# Patient Record
Sex: Male | Born: 1960 | Race: White | Hispanic: No | Marital: Single | Smoking: Current every day smoker
Health system: Southern US, Community
[De-identification: ages and names within clinical notes are randomized; demographics above are authoritative.]

## PROBLEM LIST (undated history)

## (undated) DIAGNOSIS — K635 Polyp of colon: Secondary | ICD-10-CM

## (undated) DIAGNOSIS — F329 Major depressive disorder, single episode, unspecified: Secondary | ICD-10-CM

## (undated) DIAGNOSIS — R7989 Other specified abnormal findings of blood chemistry: Secondary | ICD-10-CM

## (undated) DIAGNOSIS — T7840XA Allergy, unspecified, initial encounter: Secondary | ICD-10-CM

## (undated) DIAGNOSIS — K6289 Other specified diseases of anus and rectum: Secondary | ICD-10-CM

## (undated) DIAGNOSIS — M1990 Unspecified osteoarthritis, unspecified site: Secondary | ICD-10-CM

## (undated) DIAGNOSIS — F32A Depression, unspecified: Secondary | ICD-10-CM

## (undated) HISTORY — DX: Polyp of colon: K63.5

## (undated) HISTORY — DX: Allergy, unspecified, initial encounter: T78.40XA

## (undated) HISTORY — PX: VASECTOMY: SHX75

## (undated) HISTORY — PX: ELBOW SURGERY: SHX618

## (undated) HISTORY — DX: Depression, unspecified: F32.A

## (undated) HISTORY — DX: Other specified abnormal findings of blood chemistry: R79.89

## (undated) HISTORY — DX: Major depressive disorder, single episode, unspecified: F32.9

## (undated) HISTORY — DX: Unspecified osteoarthritis, unspecified site: M19.90

## (undated) HISTORY — DX: Other specified diseases of anus and rectum: K62.89

## (undated) HISTORY — PX: TONSILLECTOMY: SUR1361

## (undated) HISTORY — PX: WISDOM TOOTH EXTRACTION: SHX21

---

## 2004-07-25 ENCOUNTER — Ambulatory Visit: Payer: Self-pay | Admitting: Family Medicine

## 2004-09-30 ENCOUNTER — Ambulatory Visit: Payer: Self-pay | Admitting: Family Medicine

## 2005-06-20 ENCOUNTER — Ambulatory Visit: Payer: Self-pay | Admitting: Family Medicine

## 2005-07-17 ENCOUNTER — Ambulatory Visit: Payer: Self-pay | Admitting: Family Medicine

## 2005-07-24 ENCOUNTER — Ambulatory Visit: Payer: Self-pay | Admitting: Family Medicine

## 2007-05-01 ENCOUNTER — Emergency Department (HOSPITAL_COMMUNITY): Admission: EM | Admit: 2007-05-01 | Discharge: 2007-05-01 | Payer: Self-pay | Admitting: Emergency Medicine

## 2008-08-22 ENCOUNTER — Emergency Department (HOSPITAL_COMMUNITY): Admission: EM | Admit: 2008-08-22 | Discharge: 2008-08-22 | Payer: Self-pay | Admitting: Emergency Medicine

## 2008-08-30 ENCOUNTER — Encounter: Admission: RE | Admit: 2008-08-30 | Discharge: 2008-08-30 | Payer: Self-pay | Admitting: Chiropractic Medicine

## 2010-05-06 ENCOUNTER — Telehealth: Payer: Self-pay | Admitting: Family Medicine

## 2010-05-07 ENCOUNTER — Ambulatory Visit: Payer: Self-pay | Admitting: Family Medicine

## 2010-05-07 DIAGNOSIS — F3289 Other specified depressive episodes: Secondary | ICD-10-CM | POA: Insufficient documentation

## 2010-05-07 DIAGNOSIS — M1990 Unspecified osteoarthritis, unspecified site: Secondary | ICD-10-CM | POA: Insufficient documentation

## 2010-05-07 DIAGNOSIS — F329 Major depressive disorder, single episode, unspecified: Secondary | ICD-10-CM | POA: Insufficient documentation

## 2010-05-07 DIAGNOSIS — J309 Allergic rhinitis, unspecified: Secondary | ICD-10-CM | POA: Insufficient documentation

## 2010-05-16 ENCOUNTER — Encounter: Payer: Self-pay | Admitting: Family Medicine

## 2010-07-09 NOTE — Progress Notes (Signed)
Summary: REQUEST FOR APPT (RE-EST PT)  Phone Note Call from Patient   Caller: Patient Summary of Call: This pt called in to request an appt for acute problem of cough,  congestion.... Pt was last seen by Dr Clent Ridges in 2007 and will need to re-est.... Okay to work pt into schedule today or tomorrow?  Please advise..... Thanks.  Pt can be reached at [phone]  or   [phone].  Initial call taken by: Debbra Riding,  May 06, 2010 12:06 PM  Follow-up for Phone Call        set up for tomorrow please  Follow-up by: Nelwyn Salisbury MD,  May 06, 2010 1:46 PM  Additional Follow-up for Phone Call Additional follow up Details #1::        LMTCB  so appt can be scheduled.  Additional Follow-up by: Debbra Riding,  May 06, 2010 3:09 PM

## 2010-07-09 NOTE — Letter (Signed)
Summary: PATIENT HX FORM  PATIENT HX FORM   Imported By: Georgian Co 05/16/2010 09:41:41  _____________________________________________________________________  External Attachment:    Type:   Image     Comment:   External Document

## 2010-07-09 NOTE — Assessment & Plan Note (Signed)
Summary: COUGH, CONGESTION, H/A (PT RE-EST) OK PER DR FRY // RS   Vital Signs:  Patient profile:   50 year old male Weight:      190 pounds O2 Sat:      96 % Temp:     98.7 degrees F BP sitting:   122 / 86  (left arm) Cuff size:   regular  Vitals Entered By: Pura Spice, RN (May 07, 2010 11:15 AM) CC: to restablish cough sinus drainage headache   History of Present Illness: 50 yr old male to re-establish with [place] and for some allergy problems. We have not seen him for 3-4 years. He says he has been healthy and did not see any doctors during that time. Now for several months he has had nasal congestion, PND, ST, and an occasional dry cough. No fever. Tried Claritin and Afrin sprays with no relief.   Preventive Screening-Counseling & Management  Alcohol-Tobacco     Smoking Status: current     Packs/Day: 1.5  Allergies (verified): 1)  ! Darvocet  Past History:  Past Medical History: Allergic rhinitis Depression Osteoarthritis  Past Surgical History: Vasectomy right elbow reconstructive surgery   Family History: Reviewed history and no changes required. Family History of Arthritis Family History of CAD Male 1st degree relative <50 Family History Depression Family History High cholesterol Family History Hypertension  Social History: Reviewed history and no changes required. Divorced Current Smoker Smoking Status:  current Packs/Day:  1.5  Review of Systems  The patient denies anorexia, fever, weight loss, weight gain, vision loss, decreased hearing, hoarseness, chest pain, syncope, dyspnea on exertion, peripheral edema, prolonged cough, headaches, hemoptysis, abdominal pain, melena, hematochezia, severe indigestion/heartburn, hematuria, incontinence, genital sores, muscle weakness, suspicious skin lesions, transient blindness, difficulty walking, depression, unusual weight change, abnormal bleeding, enlarged lymph nodes, angioedema, breast masses, and  testicular masses.    Physical Exam  General:  Well-developed,well-nourished,in no acute distress; alert,appropriate and cooperative throughout examination Head:  Normocephalic and atraumatic without obvious abnormalities. No apparent alopecia or balding. Eyes:  No corneal or conjunctival inflammation noted. EOMI. Perrla. Funduscopic exam benign, without hemorrhages, exudates or papilledema. Vision grossly normal. Ears:  External ear exam shows no significant lesions or deformities.  Otoscopic examination reveals clear canals, tympanic membranes are intact bilaterally without bulging, retraction, inflammation or discharge. Hearing is grossly normal bilaterally. Nose:  External nasal examination shows no deformity or inflammation. Nasal mucosa are pink and moist without lesions or exudates. Mouth:  Oral mucosa and oropharynx without lesions or exudates.  Teeth in good repair. Neck:  No deformities, masses, or tenderness noted. Lungs:  Normal respiratory effort, chest expands symmetrically. Lungs are clear to auscultation, no crackles or wheezes.   Impression & Recommendations:  Problem # 1:  ALLERGIC RHINITIS (ICD-477.9)  His updated medication list for this problem includes:    Flonase 50 Mcg/act Susp (Fluticasone propionate) .Marland Kitchen... 2 sprays each nostril once daily    Allegra Allergy 180 Mg Tabs (Fexofenadine hcl) ..... Once daily  Problem # 2:  OSTEOARTHRITIS (ICD-715.90)  Problem # 3:  DEPRESSION (ICD-311)  Complete Medication List: 1)  Flonase 50 Mcg/act Susp (Fluticasone propionate) .... 2 sprays each nostril once daily 2)  Allegra Allergy 180 Mg Tabs (Fexofenadine hcl) .... Once daily  Patient Instructions: 1)  Please schedule a follow-up appointment as needed .  Prescriptions: FLONASE 50 MCG/ACT SUSP (FLUTICASONE PROPIONATE) 2 sprays each nostril once daily  #30 x 11   Entered and Authorized by:   Tera Mater  Clent Ridges MD   Signed by:   Nelwyn Salisbury MD on 05/07/2010   Method used:    Electronically to        [id number] (retail)       [address] [place], [place]  [address]       Ph: [phone]       Fax: [phone]   RxID:   [id number]    Orders Added: 1)  New Patient Level III [id number]

## 2011-05-16 ENCOUNTER — Telehealth: Payer: Self-pay | Admitting: Family Medicine

## 2011-05-16 NOTE — Telephone Encounter (Signed)
Pt walked in and complained of having neck pain and trouble sleeping. Dr. Clent Ridges suggested taking Advil 800 mg take 1 po q6hrs and use moist heat. Advised pt to make a appointment to see Dr. Clent Ridges.

## 2011-05-19 ENCOUNTER — Ambulatory Visit (INDEPENDENT_AMBULATORY_CARE_PROVIDER_SITE_OTHER): Payer: BC Managed Care – PPO | Admitting: Family Medicine

## 2011-05-19 ENCOUNTER — Encounter: Payer: Self-pay | Admitting: Family Medicine

## 2011-05-19 DIAGNOSIS — J111 Influenza due to unidentified influenza virus with other respiratory manifestations: Secondary | ICD-10-CM

## 2011-05-19 MED ORDER — OSELTAMIVIR PHOSPHATE 75 MG PO CAPS: 75.0000 mg | ORAL_CAPSULE | Freq: Two times a day (BID) | ORAL | Status: AC

## 2011-05-19 NOTE — Progress Notes (Signed)
  Subjective:    Patient ID: Christopher Solis, male    DOB: 08-Jul-1960, 50 y.o.   MRN: [id number]  HPI Here for 2 days of body aches, fevers, and a HA. Some nausea without vomiting, no cough.   Review of Systems  Constitutional: Positive for fever.  HENT: Negative.   Eyes: Negative.   Respiratory: Negative.   Musculoskeletal: Positive for myalgias.       Objective:   Physical Exam  Constitutional: He appears well-developed and well-nourished.  HENT:  Left Ear: External ear normal.  Nose: Nose normal.  Mouth/Throat: Oropharynx is clear and moist. No oropharyngeal exudate.  Eyes: Conjunctivae are normal.  Neck: No thyromegaly present.  Pulmonary/Chest: Effort normal and breath sounds normal. No respiratory distress. He has no wheezes. He has no rales. He exhibits no tenderness.  Lymphadenopathy:    He has no cervical adenopathy.          Assessment & Plan:  Rest, fluids, Motrin prn. Take Tamiflu. Off work today and tomorrow

## 2011-06-24 ENCOUNTER — Ambulatory Visit (INDEPENDENT_AMBULATORY_CARE_PROVIDER_SITE_OTHER): Payer: BC Managed Care – PPO | Admitting: Family Medicine

## 2011-06-24 ENCOUNTER — Encounter: Payer: Self-pay | Admitting: Family Medicine

## 2011-06-24 VITALS — BP 110/70 | HR 102 | Temp 98.9°F | Wt 174.0 lb

## 2011-06-24 DIAGNOSIS — J329 Chronic sinusitis, unspecified: Secondary | ICD-10-CM

## 2011-06-24 MED ORDER — HYDROCODONE-HOMATROPINE 5-1.5 MG/5ML PO SYRP: 5.0000 mL | ORAL_SOLUTION | ORAL | Status: AC | PRN

## 2011-06-24 MED ORDER — METHYLPREDNISOLONE ACETATE 80 MG/ML IJ SUSP
120.0000 mg | Freq: Once | INTRAMUSCULAR | Status: AC
  Administered 2011-06-24: 120 mg via INTRAMUSCULAR

## 2011-06-24 MED ORDER — AMOXICILLIN-POT CLAVULANATE 875-125 MG PO TABS: 1.0000 | ORAL_TABLET | Freq: Two times a day (BID) | ORAL | Status: AC

## 2011-06-24 NOTE — Progress Notes (Signed)
Addended by: Aniceto Boss A on: 06/24/2011 12:05 PM   Modules accepted: Orders

## 2011-06-24 NOTE — Progress Notes (Signed)
  Subjective:    Patient ID: Christopher Solis, male    DOB: [birth year], 51 y.o.   MRN: [id number]  HPI Here for 3 days of HA, sinus pressure, PND, ST, and a dry cough. No fever.    Review of Systems  Constitutional: Negative.   HENT: Positive for congestion, postnasal drip and sinus pressure.   Eyes: Negative.   Respiratory: Positive for cough.        Objective:   Physical Exam  Pulmonary/Chest: Effort normal and breath sounds normal. No respiratory distress. He has no wheezes. He has no rales. He exhibits no tenderness.          Assessment & Plan:  Drink fluids. Off work yesterday and today

## 2011-06-25 ENCOUNTER — Telehealth: Payer: Self-pay | Admitting: Family Medicine

## 2011-06-25 NOTE — Telephone Encounter (Signed)
Pt need work note from 06-25-2011 and return to work on 06-26-2011

## 2011-06-26 NOTE — Telephone Encounter (Signed)
Please get him a work note  

## 2011-06-26 NOTE — Telephone Encounter (Signed)
Spoke with pt and he does not need the note now.

## 2011-12-19 ENCOUNTER — Ambulatory Visit (INDEPENDENT_AMBULATORY_CARE_PROVIDER_SITE_OTHER): Payer: BC Managed Care – PPO | Admitting: Family Medicine

## 2011-12-19 ENCOUNTER — Encounter: Payer: Self-pay | Admitting: Family Medicine

## 2011-12-19 VITALS — BP 118/80 | HR 85 | Temp 98.9°F | Wt 176.0 lb

## 2011-12-19 DIAGNOSIS — M542 Cervicalgia: Secondary | ICD-10-CM

## 2011-12-19 MED ORDER — HYDROCODONE-ACETAMINOPHEN 10-325 MG PO TABS: 1.0000 | ORAL_TABLET | Freq: Four times a day (QID) | ORAL | Status: DC | PRN

## 2011-12-19 NOTE — Progress Notes (Signed)
  Subjective:    Patient ID: Christopher Solis, male    DOB: 12-09-1960, 51 y.o.   MRN: [id number]  HPI Here for chronic pain in the neck which started about 5 yrs ago. The pain is steadily getting worse. His eck is stiff and he gets sharp, sometimes intense, pains in the neck which shoot down both upper arms. No weakness or numbness of the arms. He takes Ibuprofen daily for this. No trauma history. His job is very demanding physically.  Review of Systems  Constitutional: Negative.   HENT: Positive for neck pain and neck stiffness.   Respiratory: Negative.   Cardiovascular: Negative.        Objective:   Physical Exam  Constitutional: He is oriented to person, place, and time. He appears well-developed and well-nourished.  Musculoskeletal:       He has very limited ROM of the neck in all directions. There is a lot of spasm in the muscles of the neck, and he is very tender over the C4 to C7 area.   Neurological: He is alert and oriented to person, place, and time. No cranial nerve deficit.          Assessment & Plan:  Neck pain. Set up a cervical spine MRI soon.

## 2011-12-21 ENCOUNTER — Encounter: Payer: Self-pay | Admitting: Family Medicine

## 2011-12-22 ENCOUNTER — Telehealth: Payer: Self-pay | Admitting: Family Medicine

## 2011-12-22 NOTE — Telephone Encounter (Signed)
I did give pt work note.

## 2011-12-24 ENCOUNTER — Other Ambulatory Visit: Payer: BC Managed Care – PPO

## 2012-03-11 ENCOUNTER — Ambulatory Visit (INDEPENDENT_AMBULATORY_CARE_PROVIDER_SITE_OTHER): Payer: BC Managed Care – PPO | Admitting: Family Medicine

## 2012-03-11 ENCOUNTER — Encounter: Payer: Self-pay | Admitting: Family Medicine

## 2012-03-11 VITALS — BP 116/80 | HR 88 | Temp 98.6°F | Wt 181.0 lb

## 2012-03-11 DIAGNOSIS — IMO0002 Reserved for concepts with insufficient information to code with codable children: Secondary | ICD-10-CM

## 2012-03-11 DIAGNOSIS — S76019A Strain of muscle, fascia and tendon of unspecified hip, initial encounter: Secondary | ICD-10-CM

## 2012-03-11 MED ORDER — HYDROCODONE-ACETAMINOPHEN 10-325 MG PO TABS: 1.0000 | ORAL_TABLET | Freq: Four times a day (QID) | ORAL | Status: AC | PRN

## 2012-03-11 NOTE — Progress Notes (Signed)
  Subjective:    Patient ID: Christopher Solis, male    DOB: 02-04-1961, 51 y.o.   MRN: [id number]  HPI Here for 2 days of sharp pains in the anterior right hip. Yesterday morning as he first got out of bed he felt a sudden pulling in this area with pain, and it has hurt ever since. Using heat and Vicodin.    Review of Systems  Constitutional: Negative.   Musculoskeletal: Positive for arthralgias.       Objective:   Physical Exam  Constitutional:       In pain, has a slight limp  Musculoskeletal:       Tender over the right anterior superior iliac spine and the proximal thigh, full ROM          Assessment & Plan:  This is a hip flexor strain. Written out of work yesterday and today. Rest, heat, Vicodin. This should resolve over the next 4-6 weeks

## 2012-03-12 ENCOUNTER — Ambulatory Visit: Payer: BC Managed Care – PPO | Admitting: Family Medicine

## 2012-03-15 ENCOUNTER — Telehealth: Payer: Self-pay | Admitting: Family Medicine

## 2012-03-15 NOTE — Telephone Encounter (Signed)
Note was approved and I gave to pt.

## 2012-03-15 NOTE — Telephone Encounter (Signed)
Patient needs a note for being out of work from last Wednesday through today, being released tomorrow to work.  Thanks

## 2012-05-11 ENCOUNTER — Ambulatory Visit (INDEPENDENT_AMBULATORY_CARE_PROVIDER_SITE_OTHER): Payer: BC Managed Care – PPO | Admitting: Family Medicine

## 2012-05-11 ENCOUNTER — Encounter: Payer: Self-pay | Admitting: Family Medicine

## 2012-05-11 VITALS — BP 130/84 | HR 104 | Temp 98.9°F | Wt 178.0 lb

## 2012-05-11 DIAGNOSIS — J329 Chronic sinusitis, unspecified: Secondary | ICD-10-CM

## 2012-05-11 MED ORDER — AZITHROMYCIN 250 MG PO TABS: ORAL_TABLET | ORAL | Status: DC

## 2012-05-11 NOTE — Progress Notes (Signed)
  Subjective:    Patient ID: Christopher Solis, male    DOB: 1961-05-25, 51 y.o.   MRN: [id number]  HPI Here for one week of sinus pressure, PND, HA, ST, and a dry cough, no fever.    Review of Systems  Constitutional: Negative.   HENT: Positive for congestion, postnasal drip and sinus pressure.   Eyes: Negative.   Respiratory: Positive for cough.        Objective:   Physical Exam  Constitutional: He appears well-developed and well-nourished.  HENT:  Right Ear: External ear normal.  Left Ear: External ear normal.  Nose: Nose normal.  Mouth/Throat: Oropharynx is clear and moist.  Eyes: Conjunctivae normal are normal.  Neck: No thyromegaly present.  Pulmonary/Chest: Effort normal and breath sounds normal.  Lymphadenopathy:    He has no cervical adenopathy.          Assessment & Plan:  Add Mucinex D bid

## 2012-05-17 ENCOUNTER — Telehealth: Payer: Self-pay | Admitting: Family Medicine

## 2012-05-17 MED ORDER — AMOXICILLIN-POT CLAVULANATE 875-125 MG PO TABS: 1.0000 | ORAL_TABLET | Freq: Two times a day (BID) | ORAL | Status: DC

## 2012-05-17 NOTE — Telephone Encounter (Signed)
Call in in Augmentin 875 bid for 10 days

## 2012-05-17 NOTE — Telephone Encounter (Signed)
Patient Information:  Caller Name: Jorja Loa  Phone: [phone]  Patient: Christopher Solis, Christopher Solis  Gender: Male  DOB: 1961/02/27  Age: 51 Years  PCP: [facility])   Symptoms  Reason For Call & Symptoms: Patient states he was in the office on 05/11/12 diagnosed with sinusitis. Given Zpack medicaiton  and taking Mucinex D. Patient states he is not better.  Sinus pressure, throat irritation, +coughing productive yellow.  Reviewed Health History In EMR: Yes  Reviewed Medications In EMR: Yes  Reviewed Allergies In EMR: Yes  Reviewed Surgeries / Procedures: No  Date of Onset of Symptoms: 05/03/2012  Treatments Tried: Mucinex D, Zpack  Treatments Tried Worked: No  Guideline(s) Used:  Sinus Pain and Congestion  Disposition Per Guideline:   See Today or Tomorrow in Office  Reason For Disposition Reached:   Sinus congestion (pressure, fullness) present > 10 days  Advice Given:  Reassurance:   Sinus congestion is a normal part of a cold.  Usually home treatment with nasal washes can prevent an actual bacterial sinus infection.  For a Runny Nose With Profuse Discharge:  Nasal mucus and discharge helps to wash viruses and bacteria out of the nose and sinuses.  Blowing the nose is all that is needed.  For a Stuffy Nose - Use Nasal Washes:  Introduction: Saline (salt water) nasal irrigation (nasal wash) is an effective and simple home remedy for treating stuffy nose and sinus congestion. The nose can be irrigated by pouring, spraying, or squirting salt water into the nose and then letting it run back out.  How it Helps: The salt water rinses out excess mucus, washes out any irritants (dust, allergens) that might be present, and moistens the nasal cavity.  Methods: There are several ways to perform nasal irrigation. You can use a saline nasal spray bottle (available over-the-counter), a rubber ear syringe, a medical syringe without the needle, or a Neti Pot.  Pain and Fever Medicines:  Ibuprofen (e.g., Motrin, Advil):  Another choice is to take 600 mg (three 200 mg pills) by mouth every 8 hours.  Hydration:  Drink plenty of liquids (6-8 glasses of water daily). If the air in your home is dry, use a cool mist humidifier  Hydration:  Drink plenty of liquids (6-8 glasses of water daily). If the air in your home is dry, use a cool mist humidifier  Call Back If:   Severe pain lasts longer than 2 hours after pain medicine  Sinus congestion (fullness) lasts longer than 10 days  Fever lasts longer than 3 days  You become worse.  Office Follow Up:  Does the office need to follow up with this patient?: Yes  Instructions For The Office: Patient request prescription. Zpack not strong enough. Cough medication to help sleep. Cannot make appt . patient has to work.  Patient Refused Recommendation:  Patient Requests Prescription  Patient request prescription. Zpack not strong enough. Cough medication to help sleep. Cannot make appt . patient has to work.  RN Note:  Patient request another antibiotic that is stronger. Unable to make appt. Working.  (1) Antibiotic (2) Cough medication.  Patient uses [facility]

## 2012-05-17 NOTE — Telephone Encounter (Signed)
I sent script e-scribe and spoke with pt. 

## 2012-09-01 ENCOUNTER — Ambulatory Visit (INDEPENDENT_AMBULATORY_CARE_PROVIDER_SITE_OTHER): Payer: BC Managed Care – PPO | Admitting: Family Medicine

## 2012-09-01 ENCOUNTER — Encounter: Payer: Self-pay | Admitting: Family Medicine

## 2012-09-01 VITALS — BP 120/80 | HR 106 | Temp 98.5°F | Wt 178.0 lb

## 2012-09-01 DIAGNOSIS — J019 Acute sinusitis, unspecified: Secondary | ICD-10-CM

## 2012-09-01 DIAGNOSIS — R6882 Decreased libido: Secondary | ICD-10-CM

## 2012-09-01 LAB — HEPATIC FUNCTION PANEL
ALT: 19 U/L (ref 0–53)
Albumin: 4.5 g/dL (ref 3.5–5.2)
Alkaline Phosphatase: 67 U/L (ref 39–117)
Total Protein: 7.5 g/dL (ref 6.0–8.3)

## 2012-09-01 LAB — POCT URINALYSIS DIPSTICK
Glucose, UA: NEGATIVE
Nitrite, UA: NEGATIVE
Protein, UA: NEGATIVE
Urobilinogen, UA: 0.2

## 2012-09-01 LAB — BASIC METABOLIC PANEL
CO2: 28 mEq/L (ref 19–32)
Calcium: 9.9 mg/dL (ref 8.4–10.5)
Chloride: 100 mEq/L (ref 96–112)
Creatinine, Ser: 1.2 mg/dL (ref 0.4–1.5)
Glucose, Bld: 94 mg/dL (ref 70–99)

## 2012-09-01 LAB — CBC WITH DIFFERENTIAL/PLATELET
Basophils Absolute: 0 10*3/uL (ref 0.0–0.1)
Eosinophils Absolute: 0.1 10*3/uL (ref 0.0–0.7)
Eosinophils Relative: 1 % (ref 0.0–5.0)
HCT: 51.8 % (ref 39.0–52.0)
Lymphs Abs: 1.9 10*3/uL (ref 0.7–4.0)
MCV: 93 fl (ref 78.0–100.0)
Monocytes Absolute: 0.8 10*3/uL (ref 0.1–1.0)
Neutrophils Relative %: 61.6 % (ref 43.0–77.0)
Platelets: 358 10*3/uL (ref 150.0–400.0)
RDW: 12.6 % (ref 11.5–14.6)
WBC: 7.5 10*3/uL (ref 4.5–10.5)

## 2012-09-01 LAB — TESTOSTERONE: Testosterone: 356.71 ng/dL (ref 350.00–890.00)

## 2012-09-01 MED ORDER — LEVOFLOXACIN 500 MG PO TABS: 500.0000 mg | ORAL_TABLET | Freq: Every day | ORAL | Status: AC

## 2012-09-01 NOTE — Progress Notes (Signed)
  Subjective:    Patient ID: Christopher Solis, male    DOB: 1960-08-30, 52 y.o.   MRN: [id number]  HPI Here for one week of sinus pressure, PND, ST, and a dry cough. No fever. Also he stays tired all the time , his erections are weak, and his sexual desire is very low.    Review of Systems  Constitutional: Negative.   HENT: Positive for congestion, postnasal drip and sinus pressure.   Eyes: Negative.   Respiratory: Positive for cough.        Objective:   Physical Exam  Constitutional: He appears well-developed and well-nourished.  HENT:  Right Ear: External ear normal.  Left Ear: External ear normal.  Nose: Nose normal.  Mouth/Throat: Oropharynx is clear and moist.  Eyes: Conjunctivae are normal.  Pulmonary/Chest: Effort normal and breath sounds normal.  Lymphadenopathy:    He has no cervical adenopathy.          Assessment & Plan:  Treat the sinusitis with Levaquin. Get labs today

## 2012-09-02 ENCOUNTER — Telehealth: Payer: Self-pay | Admitting: Family Medicine

## 2012-09-02 NOTE — Telephone Encounter (Signed)
Pt needs a script for cough syrup, he did not get any rest last night due to coughing.

## 2012-09-02 NOTE — Progress Notes (Signed)
Quick Note:  I left voice message with results. ______ 

## 2012-09-03 ENCOUNTER — Telehealth: Payer: Self-pay | Admitting: Family Medicine

## 2012-09-03 MED ORDER — HYDROCODONE-HOMATROPINE 5-1.5 MG/5ML PO SYRP: 5.0000 mL | ORAL_SOLUTION | ORAL | Status: DC | PRN

## 2012-09-03 NOTE — Telephone Encounter (Signed)
Patient called stating that he need his work excuse extended to include Thursday and Friday. Please assist.

## 2012-09-03 NOTE — Telephone Encounter (Signed)
Note is ready for pick up and spoke with pt.

## 2012-09-03 NOTE — Telephone Encounter (Signed)
I called in script 

## 2012-09-03 NOTE — Telephone Encounter (Signed)
Call in Hydromet 5 ml q 4 hours prn cough, 240 ml with no rf

## 2012-10-15 ENCOUNTER — Encounter: Payer: Self-pay | Admitting: Family Medicine

## 2012-10-15 ENCOUNTER — Ambulatory Visit (INDEPENDENT_AMBULATORY_CARE_PROVIDER_SITE_OTHER): Payer: BC Managed Care – PPO | Admitting: Family Medicine

## 2012-10-15 VITALS — BP 110/80 | Temp 98.3°F | Ht 69.25 in | Wt 175.0 lb

## 2012-10-15 DIAGNOSIS — Z23 Encounter for immunization: Secondary | ICD-10-CM

## 2012-10-15 DIAGNOSIS — Z0000 Encounter for general adult medical examination without abnormal findings: Secondary | ICD-10-CM

## 2012-10-15 LAB — PSA: PSA: 2.02 ng/mL (ref 0.10–4.00)

## 2012-10-15 MED ORDER — SILDENAFIL CITRATE 100 MG PO TABS: 100.0000 mg | ORAL_TABLET | ORAL | Status: DC | PRN

## 2012-10-15 MED ORDER — SILDENAFIL CITRATE 100 MG PO TABS: 50.0000 mg | ORAL_TABLET | Freq: Every day | ORAL | Status: DC | PRN

## 2012-10-15 NOTE — Progress Notes (Signed)
  Subjective:    Patient ID: Christopher Solis, male    DOB: [birth year], 52 y.o.   MRN: [id number]  HPI 52 yr old male for a cpx. He feels fine but asks to try Viagra.    Review of Systems  Constitutional: Negative.   HENT: Negative.   Eyes: Negative.   Respiratory: Negative.   Cardiovascular: Negative.   Gastrointestinal: Negative.   Genitourinary: Negative.   Musculoskeletal: Negative.   Skin: Negative.   Neurological: Negative.   Psychiatric/Behavioral: Negative.        Objective:   Physical Exam  Constitutional: He is oriented to person, place, and time. He appears well-developed and well-nourished. No distress.  HENT:  Head: Normocephalic and atraumatic.  Right Ear: External ear normal.  Left Ear: External ear normal.  Nose: Nose normal.  Mouth/Throat: Oropharynx is clear and moist. No oropharyngeal exudate.  Eyes: Conjunctivae and EOM are normal. Pupils are equal, round, and reactive to light. Right eye exhibits no discharge. Left eye exhibits no discharge. No scleral icterus.  Neck: Neck supple. No JVD present. No tracheal deviation present. No thyromegaly present.  Cardiovascular: Normal rate, regular rhythm, normal heart sounds and intact distal pulses.  Exam reveals no gallop and no friction rub.   No murmur heard. EKG normal   Pulmonary/Chest: Effort normal and breath sounds normal. No respiratory distress. He has no wheezes. He has no rales. He exhibits no tenderness.  Abdominal: Soft. Bowel sounds are normal. He exhibits no distension and no mass. There is no tenderness. There is no rebound and no guarding.  Genitourinary: Rectum normal, prostate normal and penis normal. Guaiac negative stool. No penile tenderness.  Musculoskeletal: Normal range of motion. He exhibits no edema and no tenderness.  Lymphadenopathy:    He has no cervical adenopathy.  Neurological: He is alert and oriented to person, place, and time. He has normal reflexes. No cranial nerve deficit. He  exhibits normal muscle tone. Coordination normal.  Skin: Skin is warm and dry. No rash noted. He is not diaphoretic. No erythema. No pallor.  Psychiatric: He has a normal mood and affect. His behavior is normal. Judgment and thought content normal.          Assessment & Plan:  Well exam. Set up a colonoscopy

## 2012-10-19 ENCOUNTER — Encounter: Payer: Self-pay | Admitting: Gastroenterology

## 2012-11-16 ENCOUNTER — Ambulatory Visit (AMBULATORY_SURGERY_CENTER): Payer: BC Managed Care – PPO

## 2012-11-16 ENCOUNTER — Encounter: Payer: Self-pay | Admitting: Gastroenterology

## 2012-11-16 VITALS — Ht 70.5 in | Wt 177.8 lb

## 2012-11-16 DIAGNOSIS — Z1211 Encounter for screening for malignant neoplasm of colon: Secondary | ICD-10-CM

## 2012-11-16 MED ORDER — MOVIPREP 100 G PO SOLR: ORAL | Status: DC

## 2012-11-29 ENCOUNTER — Other Ambulatory Visit (INDEPENDENT_AMBULATORY_CARE_PROVIDER_SITE_OTHER): Payer: BC Managed Care – PPO

## 2012-11-29 ENCOUNTER — Ambulatory Visit (AMBULATORY_SURGERY_CENTER): Payer: BC Managed Care – PPO | Admitting: Gastroenterology

## 2012-11-29 ENCOUNTER — Encounter: Payer: Self-pay | Admitting: Gastroenterology

## 2012-11-29 ENCOUNTER — Telehealth: Payer: Self-pay | Admitting: *Deleted

## 2012-11-29 VITALS — BP 114/76 | HR 69 | Temp 97.3°F | Resp 19 | Ht 70.0 in | Wt 177.0 lb

## 2012-11-29 DIAGNOSIS — D128 Benign neoplasm of rectum: Secondary | ICD-10-CM

## 2012-11-29 DIAGNOSIS — Z1211 Encounter for screening for malignant neoplasm of colon: Secondary | ICD-10-CM

## 2012-11-29 DIAGNOSIS — D126 Benign neoplasm of colon, unspecified: Secondary | ICD-10-CM

## 2012-11-29 DIAGNOSIS — K6289 Other specified diseases of anus and rectum: Secondary | ICD-10-CM

## 2012-11-29 DIAGNOSIS — R198 Other specified symptoms and signs involving the digestive system and abdomen: Secondary | ICD-10-CM

## 2012-11-29 LAB — CBC WITH DIFFERENTIAL/PLATELET
Basophils Absolute: 0 10*3/uL (ref 0.0–0.1)
Eosinophils Relative: 0.6 % (ref 0.0–5.0)
Hemoglobin: 16.6 g/dL (ref 13.0–17.0)
Lymphocytes Relative: 23.8 % (ref 12.0–46.0)
Monocytes Relative: 9.7 % (ref 3.0–12.0)
Platelets: 382 10*3/uL (ref 150.0–400.0)
RDW: 13 % (ref 11.5–14.6)
WBC: 8.9 10*3/uL (ref 4.5–10.5)

## 2012-11-29 LAB — COMPREHENSIVE METABOLIC PANEL
ALT: 19 U/L (ref 0–53)
Albumin: 4.5 g/dL (ref 3.5–5.2)
CO2: 29 mEq/L (ref 19–32)
Calcium: 9.7 mg/dL (ref 8.4–10.5)
Chloride: 105 mEq/L (ref 96–112)
GFR: 78.52 mL/min (ref >60.00)
Sodium: 141 mEq/L (ref 135–145)
Total Protein: 7.6 g/dL (ref 6.0–8.3)

## 2012-11-29 MED ORDER — SODIUM CHLORIDE 0.9 % IV SOLN: 500.0000 mL | INTRAVENOUS | Status: DC

## 2012-11-29 NOTE — Op Note (Signed)
[facility] [address] [place], [address]   COLONOSCOPY PROCEDURE REPORT  PATIENT: Christopher Solis, Christopher Solis  MR#: [id number] BIRTHDATE: Apr 28, 1961 , 52  yrs. old GENDER: Male ENDOSCOPIST: Mardella Layman, MD, [facility] REFERRED BY:  Nelwyn Salisbury, M.D. PROCEDURE DATE:  11/29/2012 PROCEDURE:   Colonoscopy with biopsy and snare polypectomy ASA CLASS:   Class II INDICATIONS:average risk screening. MEDICATIONS: Propofol (Diprivan) 340 mg IV  DESCRIPTION OF PROCEDURE:   After the risks and benefits and of the procedure were explained, informed consent was obtained.  A digital rectal exam revealed no abnormalities of the rectum.    The LB [id number]  endoscope was introduced through the anus and advanced to the cecum, which was identified by both the appendix and ileocecal valve .  The quality of the prep was excellent, using MoviPrep .  The instrument was then slowly withdrawn as the colon was fully examined.     COLON FINDINGS: Three firm sessile polyps ranging between 5-95mm in size were found at the hepatic flexure.  A polypectomy was performed using snare cautery.  The resection was complete and the polyp tissue was completely retrieved.   Three firm and smooth sessile polyps ranging between 5-52mm in size were found in the sigmoid colon.  A polypectomy was performed using snare cautery. The resection was complete and the polyp tissue was completely retrieved.   A polypoid shaped, smooth and firm mass was found in the rectum. This rectal mass started on the middle rectal fold and extended to the 15-18 cm level, with circumferential in nature, very soft and polypoid in appearance without ulcerations or bleeding.  In age of this polypoid tumor was removed with snare cautery, and also multiple biopsies were obtained from the base and margins of this large lesion.some smaller hyperplastic rectosigmoid nodules were noted and not biopsied.    Retroflexed views  revealed no abnormalities.     The scope was then withdrawn from the patient and the procedure completed.  COMPLICATIONS: There were no complications. ENDOSCOPIC IMPRESSION: 1.   Three sessile polyps ranging between 5-12mm in size were found at the hepatic flexure; polypectomy was performed using snare cautery 2.   Three sessile polyps ranging between 5-50mm in size were found in the sigmoid colon; polypectomy was performed using snare cautery  3.   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  Multiple biopsies and the biopsies were obtained for pathologic exam.  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  RECOMMENDATIONS: 1.  Await biopsy results 2.  My office will arrange for you to have a CT scan of abdomen and pelvis. 3.  My office will arrange for you to meet with a surgeon.   REPEAT EXAM:  HQ:IONGEX Gross, MD  _______________________________ eSigned:  Mardella Layman, MD, [facility] 11/29/2012 11:09 AM     PATIENT NAME:  [name], [name] MR#: [id number]

## 2012-11-29 NOTE — Progress Notes (Signed)
Patient did not have preoperative order for IV antibiotic SSI prophylaxis. (G8918)  Patient did not experience any of the following events: a burn prior to discharge; a fall within the facility; wrong site/side/patient/procedure/implant event; or a hospital transfer or hospital admission upon discharge from the facility. (G8907)  

## 2012-11-29 NOTE — Patient Instructions (Addendum)
YOU HAD AN ENDOSCOPIC PROCEDURE TODAY AT THE [facility]: Refer to the procedure report that was given to you for any specific questions about what was found during the examination.  If the procedure report does not answer your questions, please call your gastroenterologist to clarify.  If you requested that your care partner not be given the details of your procedure findings, then the procedure report has been included in a sealed envelope for you to review at your convenience later.  YOU SHOULD EXPECT: Some feelings of bloating in the abdomen. Passage of more gas than usual.  Walking can help get rid of the air that was put into your GI tract during the procedure and reduce the bloating. If you had a lower endoscopy (such as a colonoscopy or flexible sigmoidoscopy) you may notice spotting of blood in your stool or on the toilet paper. If you underwent a bowel prep for your procedure, then you may not have a normal bowel movement for a few days.  DIET: Your first meal following the procedure should be a light meal and then it is ok to progress to your normal diet.  A half-sandwich or bowl of soup is an example of a good first meal.  Heavy or fried foods are harder to digest and may make you feel nauseous or bloated.  Likewise meals heavy in dairy and vegetables can cause extra gas to form and this can also increase the bloating.  Drink plenty of fluids but you should avoid alcoholic beverages for 24 hours.    ACTIVITY: Your care partner should take you home directly after the procedure.  You should plan to take it easy, moving slowly for the rest of the day.  You can resume normal activity the day after the procedure however you should NOT DRIVE or use heavy machinery for 24 hours (because of the sedation medicines used during the test).    SYMPTOMS TO REPORT IMMEDIATELY: A gastroenterologist can be reached at any hour.  During normal business hours, 8:30 AM to 5:00 PM Monday through Friday,  call [phone].  After hours and on weekends, please call the GI answering service at [phone] who will take a message and have the physician on call contact you.   Following lower endoscopy (colonoscopy or flexible sigmoidoscopy):  Excessive amounts of blood in the stool  Significant tenderness or worsening of abdominal pains  Swelling of the abdomen that is new, acute  Fever of 100F or higher  FOLLOW UP: If any biopsies were taken you will be contacted by phone or by letter within the next 1-3 weeks.  Call your gastroenterologist if you have not heard about the biopsies in 3 weeks.  Our staff will call the home number listed on your records the next business day following your procedure to check on you and address any questions or concerns that you may have at that time regarding the information given to you following your procedure. This is a courtesy call and so if there is no answer at the home number and we have not heard from you through the emergency physician on call, we will assume that you have returned to your regular daily activities without incident.  SIGNATURES/CONFIDENTIALITY: You and/or your care partner have signed paperwork which will be entered into your electronic medical record.  These signatures attest to the fact that that the information above on your After Visit Summary has been reviewed and is understood.  Full responsibility of the confidentiality  of this discharge information lies with you and/or your care-partner.    A CT WILL BE ORDERED FOR YOU AT [facility] ON [address];  YOUR WILL BE REFERRED TO A SURGEON.  CYNTHIA CAME UP AND EXPLAINED EVERYTHING ABOUT THE CT, AND SHE GAVE HIM THE CONTRAST.  STATES THAT SHE IS STILL WORKING ON THE SURGEON. PATIENT IS TO HAVE BLOODWORK TODAY.

## 2012-11-29 NOTE — Telephone Encounter (Signed)
Scheduled pt for CT of the abdomen for tomorrow at 2pm. Pt was given instructions and contrast. He will have labs drawn today. Pt stated understanding. Took pt a letter with surgical referral info to him in the lab. Pt will call for questions.

## 2012-11-29 NOTE — Progress Notes (Signed)
Procedure ends, to recovery, report given and VSS. 

## 2012-11-30 ENCOUNTER — Ambulatory Visit (INDEPENDENT_AMBULATORY_CARE_PROVIDER_SITE_OTHER)
Admission: RE | Admit: 2012-11-30 | Discharge: 2012-11-30 | Disposition: A | Discharge status: Home / Self Care | Payer: BC Managed Care – PPO | Source: Ambulatory Visit | Attending: Gastroenterology | Admitting: Gastroenterology

## 2012-11-30 ENCOUNTER — Telehealth: Payer: Self-pay

## 2012-11-30 ENCOUNTER — Encounter: Payer: Self-pay | Admitting: Gastroenterology

## 2012-11-30 ENCOUNTER — Telehealth: Payer: Self-pay | Admitting: *Deleted

## 2012-11-30 DIAGNOSIS — K6289 Other specified diseases of anus and rectum: Secondary | ICD-10-CM

## 2012-11-30 DIAGNOSIS — R198 Other specified symptoms and signs involving the digestive system and abdomen: Secondary | ICD-10-CM

## 2012-11-30 LAB — CEA: CEA: 7.6 ng/mL — ABNORMAL HIGH (ref 0.0–5.0)

## 2012-11-30 MED ORDER — IOHEXOL 300 MG/ML  SOLN
100.0000 mL | Freq: Once | INTRAMUSCULAR | Status: AC | PRN
  Administered 2012-11-30: 100 mL via INTRAVENOUS

## 2012-11-30 NOTE — Telephone Encounter (Signed)
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  We need to go ahead with surgical referral ASAP.  They can decide about possible oncology evaluation also.  His CEA level also is mildly elevated but other labs look good.  CT scan otherwise does not show distant metastases.  His copy all of this to Dr. Luretha Murphy at [facility] surgery ------

## 2012-11-30 NOTE — Telephone Encounter (Signed)
Informed pt Dr Jarold Motto tried to call him, but got no answer. Discussed CT results with him and that is reassuring he has an appt with Dr Daphine Deutscher next week. He asked about the path results and the rectal area showed adenomas only w/o dysplasia; explained Dr Daphine Deutscher can discuss this him in greater detail; pt stated understanding.

## 2012-11-30 NOTE — Telephone Encounter (Signed)
  Follow up Call-  Call back number 11/29/2012  Post procedure Call Back phone  # [phone]  Permission to leave phone message Yes     Patient questions:  Do you have a fever, pain , or abdominal swelling? no Pain Score  0 *  Have you tolerated food without any problems? yes  Have you been able to return to your normal activities? yes  Do you have any questions about your discharge instructions: Diet   no Medications  no Follow up visit  no  Do you have questions or concerns about your Care? no  Actions: * If pain score is 4 or above: No action needed, pain <4.   No problems per the pt.  He asked how long it would be before he gets results.  I advised him it might be a few days, but Dr. Jarold Motto would get back with him ASAP.  Pt scheduled for CT and he has the instructions.  He had no questions about CT. Maw

## 2012-12-01 ENCOUNTER — Telehealth: Payer: Self-pay | Admitting: Gastroenterology

## 2012-12-01 NOTE — Telephone Encounter (Signed)
Spoke with pt yesterday to give him results of his CT which showed an infiltrating mass with probable malignancy, he asked about the path and when we discussed it, it shows no malignancy , dysplasia or metaplasia. Pt is very confused by the 2 reports. I explained the samples taken may not be where there is malignancy. Explained Dr Daphine Deutscher may be able to offer more explanation. Can you offer anything for more for pt or call him? Thanks.

## 2012-12-01 NOTE — Telephone Encounter (Signed)
I cannot reach him despite several calls,This is a large villous polyp that may have cancer in the BASE,that is why surgical removal is the plan.Also the scan is not normal and his CEA is elevated.The endo biopsies mean nothing here in terms of need for surgical resection,,,,

## 2012-12-01 NOTE — Telephone Encounter (Signed)
Informed pt Dr Jarold Motto did attempt to call him w/o success. Relayed Dr Norval Gable findings and recommendations to pt and he states all he has thought about is cancer. Informed him again, Dr Daphine Deutscher will be able to tell him more about CT results. Pt stated understanding.

## 2012-12-09 ENCOUNTER — Ambulatory Visit (INDEPENDENT_AMBULATORY_CARE_PROVIDER_SITE_OTHER): Payer: BC Managed Care – PPO | Admitting: Surgery

## 2012-12-09 ENCOUNTER — Encounter (INDEPENDENT_AMBULATORY_CARE_PROVIDER_SITE_OTHER): Payer: Self-pay | Admitting: Surgery

## 2012-12-09 VITALS — BP 124/80 | HR 84 | Resp 16 | Ht 70.0 in | Wt 178.6 lb

## 2012-12-09 DIAGNOSIS — D374 Neoplasm of uncertain behavior of colon: Secondary | ICD-10-CM

## 2012-12-09 DIAGNOSIS — D126 Benign neoplasm of colon, unspecified: Secondary | ICD-10-CM

## 2012-12-09 NOTE — Progress Notes (Signed)
Chief Complaint:  Villous adenoma of the rectosigmoid junction  History of Present Illness:  Christopher Solis is an 52 y.o. male referred by Dr. Jarold Motto with an unresectable villous adenoma of the rectosigmoid.  Path was benign but the was large and needs surgical resection.    Past Medical History  Diagnosis Date  . Allergy   . Depression   . Osteoarthritis     Past Surgical History  Procedure Laterality Date  . Vasectomy    . Elbow surgery      reconstructive / right elbow  . Tonsillectomy    . Wisdom tooth extraction      Current Outpatient Prescriptions  Medication Sig Dispense Refill  . fexofenadine (ALLEGRA) 180 MG tablet Take 180 mg by mouth daily.      . sildenafil (VIAGRA) 100 MG tablet Take 1 tablet (100 mg total) by mouth as needed for erectile dysfunction.  10 tablet  11   No current facility-administered medications for this visit.   Propoxyphene-acetaminophen Family History  Problem Relation Age of Onset  . Arthritis    . Coronary artery disease    . Depression    . Hyperlipidemia    . Hypertension    . Hypertension Mother   . Hypertension Brother   . Heart disease Brother    Social History:   reports that he has been smoking Cigarettes.  He has a 37 pack-year smoking history. He has never used smokeless tobacco. He reports that he does not drink alcohol or use illicit drugs.   REVIEW OF SYSTEMS - PERTINENT POSITIVES ONLY: No history of DVT or prior abdominal surgery  Physical Exam:   Blood pressure 124/80, pulse 84, resp. rate 16, height 5\' 10"  (1.778 m), weight 178 lb 9.6 oz (81.012 kg). Body mass index is 25.63 kg/(m^2).  Gen:  WDWN WM NAD  Neurological: Alert and oriented to person, place, and time. Motor and sensory function is grossly intact  Head: Normocephalic and atraumatic.  Eyes: Conjunctivae are normal. Pupils are equal, round, and reactive to light. No scleral icterus.  Neck: Normal range of motion. Neck supple. No tracheal deviation or  thyromegaly present.  Cardiovascular:  SR without murmurs or gallops.  No carotid bruits Respiratory: Effort normal.  No respiratory distress. No chest wall tenderness. Breath sounds normal.  No wheezes, rales or rhonchi.  Abdomen:  Flat and nontender.   GU: Musculoskeletal: Normal range of motion. Extremities are nontender. No cyanosis, edema or clubbing noted Lymphadenopathy: No cervical, preauricular, postauricular or axillary adenopathy is present Skin: Skin is warm and dry. No rash noted. No diaphoresis. No erythema. No pallor. Pscyh: Normal mood and affect. Behavior is normal. Judgment and thought content normal.   LABORATORY RESULTS: No results found for this or any previous visit (from the past 48 hour(s)).  RADIOLOGY RESULTS: No results found.  Problem List: Patient Active Problem List   Diagnosis Date Noted  . DEPRESSION 05/07/2010  . ALLERGIC RHINITIS 05/07/2010  . OSTEOARTHRITIS 05/07/2010    Assessment & Plan: CT scan and path reviewed.  Recommend lap assisted sigmoid resection.  I have explained this to him including complications not limited to bowel leakage, colostomy, ureteral injury.  He has studied the operation on the internet and wants to proceed.      Matt B. Daphine Deutscher, MD, [facility] [phone] beeper [phone]  12/09/2012 10:06 AM

## 2013-05-04 ENCOUNTER — Ambulatory Visit (INDEPENDENT_AMBULATORY_CARE_PROVIDER_SITE_OTHER): Payer: BC Managed Care – PPO | Admitting: Family Medicine

## 2013-05-04 ENCOUNTER — Encounter: Payer: Self-pay | Admitting: Family Medicine

## 2013-05-04 VITALS — BP 120/76 | HR 82 | Temp 99.0°F | Wt 183.0 lb

## 2013-05-04 DIAGNOSIS — IMO0002 Reserved for concepts with insufficient information to code with codable children: Secondary | ICD-10-CM

## 2013-05-04 DIAGNOSIS — S76019A Strain of muscle, fascia and tendon of unspecified hip, initial encounter: Secondary | ICD-10-CM | POA: Insufficient documentation

## 2013-05-04 DIAGNOSIS — S76011A Strain of muscle, fascia and tendon of right hip, initial encounter: Secondary | ICD-10-CM

## 2013-05-04 MED ORDER — METHYLPREDNISOLONE ACETATE 80 MG/ML IJ SUSP
120.0000 mg | Freq: Once | INTRAMUSCULAR | Status: AC
  Administered 2013-05-04: 120 mg via INTRAMUSCULAR

## 2013-05-04 MED ORDER — HYDROCODONE-ACETAMINOPHEN 10-325 MG PO TABS: 1.0000 | ORAL_TABLET | Freq: Four times a day (QID) | ORAL | Status: DC | PRN

## 2013-05-04 NOTE — Addendum Note (Signed)
Addended by: Aniceto Boss A on: 05/04/2013 04:34 PM   Modules accepted: Orders

## 2013-05-04 NOTE — Progress Notes (Signed)
Pre visit review using our clinic review tool, if applicable. No additional management support is needed unless otherwise documented below in the visit note. 

## 2013-05-04 NOTE — Progress Notes (Signed)
   Subjective:    Patient ID: Christopher Solis, male    DOB: 11-03-1960, 52 y.o.   MRN: [id number]  HPI Here for an injury to the right anterior hip 10 days ago. While lifting a heavy piece of equipment at work he felt a "pop" and a sudden sharp pain. He has been using aspirin since then with no relief. This is very similar to the hip flexor strain he had in October 2013.    Review of Systems  Constitutional: Negative.   Musculoskeletal: Positive for arthralgias.       Objective:   Physical Exam  Constitutional:  In pain, limping a bit  Musculoskeletal:  Very tender over the anterior right groin at the insertion of the hip flexors onto the pelvis, full ROM           Assessment & Plan:  Rest, ice. Off work this week.

## 2013-05-09 ENCOUNTER — Telehealth: Payer: Self-pay | Admitting: Family Medicine

## 2013-05-09 DIAGNOSIS — S76011S Strain of muscle, fascia and tendon of right hip, sequela: Secondary | ICD-10-CM

## 2013-05-09 NOTE — Telephone Encounter (Signed)
I spoke with pt  

## 2013-05-09 NOTE — Telephone Encounter (Signed)
I did a referral to see Orthopedics for this

## 2013-05-09 NOTE — Telephone Encounter (Signed)
Patient Information:  Caller Name: Ison  Phone: [phone]  Patient: Christopher Solis, Christopher Solis  Gender: Male  DOB: [birth year]  Age: 52 Years  PCP: Gershon Crane [facility])  Office Follow Up:  Does the office need to follow up with this patient?: Yes  Instructions For The Office: Please call ASAP regarding disposition.  RN Note:  New onset of moderate right groin pain. Previous groin pain still present but managable. Continues to have right thigh pain present since offfice visit "like some one pulling a tube during vasectomy."  Call provider now for ED or office visit with MD approval.  Per Emmaline Kluver, advised to send record now, provider will review and staff will call patient with disposition.  Symptoms  Reason For Call & Symptoms: Constant right groin pain that is different than when seen.  Pain rated 5/10.   Saw MD 05/04/13; diagnosed with hip flexor strain.  Had steroid shot.  Using Hydrocodone with partial relief.   No swelling off scrotum or testicle.  Reviewed Health History In EMR: Yes  Reviewed Medications In EMR: Yes  Reviewed Allergies In EMR: Yes  Reviewed Surgeries / Procedures: Yes  Date of Onset of Symptoms: 05/07/2013  Treatments Tried: Rest, ice, Hydrocodonem steroid injection  Treatments Tried Worked: No  Guideline(s) Used:  Leg Pain  Disposition Per Guideline:   Go to ED Now (or to Office with PCP Approval)  Reason For Disposition Reached:   Thigh or calf pain in only one leg and present > 1 hour  Advice Given:  Call Back If:  You become worse.  RN Overrode Recommendation:  Follow Up With Office Later  Call office now; office will review with provider and call patient

## 2013-07-20 HISTORY — PX: COLON SURGERY: SHX602

## 2013-11-14 ENCOUNTER — Ambulatory Visit (INDEPENDENT_AMBULATORY_CARE_PROVIDER_SITE_OTHER): Payer: BC Managed Care – PPO | Admitting: Family Medicine

## 2013-11-14 ENCOUNTER — Encounter: Payer: Self-pay | Admitting: Family Medicine

## 2013-11-14 VITALS — BP 118/78 | HR 88 | Temp 98.9°F | Ht 70.0 in | Wt 170.0 lb

## 2013-11-14 DIAGNOSIS — M545 Low back pain, unspecified: Secondary | ICD-10-CM

## 2013-11-14 MED ORDER — SILDENAFIL CITRATE 100 MG PO TABS: 100.0000 mg | ORAL_TABLET | ORAL | Status: DC | PRN

## 2013-11-14 MED ORDER — HYDROCODONE-ACETAMINOPHEN 10-325 MG PO TABS: 1.0000 | ORAL_TABLET | Freq: Four times a day (QID) | ORAL | Status: DC | PRN

## 2013-11-14 NOTE — Progress Notes (Signed)
Pre visit review using our clinic review tool, if applicable. No additional management support is needed unless otherwise documented below in the visit note. 

## 2013-11-15 ENCOUNTER — Encounter: Payer: Self-pay | Admitting: Family Medicine

## 2013-11-15 ENCOUNTER — Telehealth: Payer: Self-pay | Admitting: Family Medicine

## 2013-11-15 NOTE — Telephone Encounter (Signed)
Pt would like you to fax note to  [employer]   Attn: Blair Heys  [phone]

## 2013-11-15 NOTE — Telephone Encounter (Signed)
Pt was seen yesterday for back pain and needs work note from 6-8 thru 11-15-13. Pt will return to work on 11-16-13. Pt will pick note up today

## 2013-11-15 NOTE — Telephone Encounter (Signed)
Per Dr. Sarajane Jews, okay to give note.

## 2013-11-15 NOTE — Progress Notes (Signed)
   Subjective:    Patient ID: Christopher Solis, male    DOB: 1960/09/03, 53 y.o.   MRN: [id number]  HPI Here for low back pain that started about one week ago. He has had this in the past, and it usually responds to Motrin but not this time. Using Biofreeze with no relief. No recent trauma. The pain radiates to both thighs. No weakness or numbness in the legs. He has see chiropractors for successful treatment of neck pain in the past but never for low back pain.    Review of Systems  Constitutional: Negative.   Musculoskeletal: Positive for back pain.       Objective:   Physical Exam  Constitutional:  In some pain, walks stiffly   Musculoskeletal:  Tender in the lower back, full ROM          Assessment & Plan:  Use heat and Motrin. Given some Vicodin to use at night. Refer to a chiropractor.

## 2013-11-15 NOTE — Telephone Encounter (Signed)
Relevant patient education mailed to patient.  

## 2013-11-15 NOTE — Telephone Encounter (Signed)
Note was faxed to below number. 

## 2013-12-29 ENCOUNTER — Encounter: Payer: Self-pay | Admitting: Internal Medicine

## 2014-01-25 ENCOUNTER — Encounter: Payer: Self-pay | Admitting: Family Medicine

## 2014-01-25 ENCOUNTER — Ambulatory Visit (INDEPENDENT_AMBULATORY_CARE_PROVIDER_SITE_OTHER): Payer: BC Managed Care – PPO | Admitting: Family Medicine

## 2014-01-25 VITALS — BP 118/66 | HR 96 | Temp 99.0°F | Ht 70.0 in | Wt 176.0 lb

## 2014-01-25 DIAGNOSIS — J019 Acute sinusitis, unspecified: Secondary | ICD-10-CM

## 2014-01-25 MED ORDER — LEVOFLOXACIN 500 MG PO TABS: 500.0000 mg | ORAL_TABLET | Freq: Every day | ORAL | Status: AC

## 2014-01-25 NOTE — Progress Notes (Signed)
   Subjective:    Patient ID: Christopher Solis, male    DOB: 01-17-1961, 53 y.o.   MRN: [id number]  HPI Here for 5 days of sinus pressure, PND, ST , and a dry cough.    Review of Systems  Constitutional: Negative.   HENT: Positive for congestion, postnasal drip and sinus pressure.   Eyes: Negative.   Respiratory: Positive for cough.        Objective:   Physical Exam  Constitutional: He appears well-developed and well-nourished.  HENT:  Right Ear: External ear normal.  Left Ear: External ear normal.  Nose: Nose normal.  Mouth/Throat: Oropharynx is clear and moist.  Eyes: Conjunctivae are normal.  Pulmonary/Chest: Effort normal and breath sounds normal.  Lymphadenopathy:    He has no cervical adenopathy.          Assessment & Plan:  Add Mucinex

## 2014-01-25 NOTE — Progress Notes (Signed)
Pre visit review using our clinic review tool, if applicable. No additional management support is needed unless otherwise documented below in the visit note. 

## 2014-03-08 ENCOUNTER — Encounter: Payer: Self-pay | Admitting: Family Medicine

## 2014-03-08 ENCOUNTER — Ambulatory Visit (INDEPENDENT_AMBULATORY_CARE_PROVIDER_SITE_OTHER): Payer: BC Managed Care – PPO | Admitting: Family Medicine

## 2014-03-08 VITALS — BP 124/72 | HR 113 | Temp 99.0°F | Ht 70.0 in | Wt 183.0 lb

## 2014-03-08 DIAGNOSIS — M546 Pain in thoracic spine: Secondary | ICD-10-CM

## 2014-03-08 DIAGNOSIS — E010 Iodine-deficiency related diffuse (endemic) goiter: Secondary | ICD-10-CM

## 2014-03-08 DIAGNOSIS — R5381 Other malaise: Secondary | ICD-10-CM

## 2014-03-08 DIAGNOSIS — E049 Nontoxic goiter, unspecified: Secondary | ICD-10-CM

## 2014-03-08 DIAGNOSIS — B079 Viral wart, unspecified: Secondary | ICD-10-CM | POA: Insufficient documentation

## 2014-03-08 DIAGNOSIS — R5383 Other fatigue: Secondary | ICD-10-CM

## 2014-03-08 MED ORDER — CYCLOBENZAPRINE HCL 10 MG PO TABS
10.0000 mg | ORAL_TABLET | Freq: Three times a day (TID) | ORAL | Status: DC | PRN
Start: 2014-03-08 — End: 2014-09-26

## 2014-03-08 NOTE — Progress Notes (Signed)
   Subjective:    Patient ID: Christopher Solis, male    DOB: 26-Jan-1961, 53 y.o.   MRN: [id number]  HPI Here with 3 issues. First he has had a wart on the right index finger for 9 months and wants it removed. He has tried cutting it out with a pocket knife but this did not work. Also he has back spasms when he lies down in bed at night and these keep him awake. Heat and .Advil do not help. Also he has felt a pressure or a lump in the throat when he swallows for the past 2 month. Food does not stop part way down. No GERD or heartburn sx. No ST or hoarseness.   Review of Systems  Constitutional: Negative.   HENT: Positive for trouble swallowing. Negative for postnasal drip, sore throat and voice change.   Respiratory: Negative.   Cardiovascular: Negative.   Gastrointestinal: Negative.   Musculoskeletal: Positive for back pain.       Objective:   Physical Exam  Constitutional: He appears well-developed and well-nourished. No distress.  HENT:  Right Ear: External ear normal.  Left Ear: External ear normal.  Nose: Nose normal.  Mouth/Throat: Oropharynx is clear and moist.  Eyes: Conjunctivae are normal.  Neck:  His thyroid gland is at the upper limits of normal in size, and the left lobe is larger than the right one. Not tender, no nodules  Lymphadenopathy:    He has no cervical adenopathy.  Skin:  Right index finger has a large flat wart on it           Assessment & Plan:  Refer to Dermatology for the wart. Try Flexeril for the back spasms. Get labs today to evaluate the thyroid and set up an [place] soon.

## 2014-03-08 NOTE — Progress Notes (Signed)
Pre visit review using our clinic review tool, if applicable. No additional management support is needed unless otherwise documented below in the visit note. 

## 2014-03-09 ENCOUNTER — Telehealth: Payer: Self-pay | Admitting: Family Medicine

## 2014-03-09 LAB — CBC WITH DIFFERENTIAL/PLATELET
BASOS PCT: 0.4 % (ref 0.0–3.0)
Basophils Absolute: 0 10*3/uL (ref 0.0–0.1)
EOS PCT: 1 % (ref 0.0–5.0)
Eosinophils Absolute: 0.1 10*3/uL (ref 0.0–0.7)
HCT: 48.7 % (ref 39.0–52.0)
Hemoglobin: 16.5 g/dL (ref 13.0–17.0)
LYMPHS PCT: 21.9 % (ref 12.0–46.0)
Lymphs Abs: 2.3 10*3/uL (ref 0.7–4.0)
MCHC: 33.9 g/dL (ref 30.0–36.0)
MCV: 95.1 fl (ref 78.0–100.0)
MONOS PCT: 10.6 % (ref 3.0–12.0)
Monocytes Absolute: 1.1 10*3/uL — ABNORMAL HIGH (ref 0.1–1.0)
NEUTROS PCT: 66.1 % (ref 43.0–77.0)
Neutro Abs: 6.9 10*3/uL (ref 1.4–7.7)
Platelets: 401 10*3/uL — ABNORMAL HIGH (ref 150.0–400.0)
RBC: 5.12 Mil/uL (ref 4.22–5.81)
RDW: 12.8 % (ref 11.5–15.5)
WBC: 10.4 10*3/uL (ref 4.0–10.5)

## 2014-03-09 LAB — HEPATIC FUNCTION PANEL
ALBUMIN: 4.7 g/dL (ref 3.5–5.2)
ALT: 18 U/L (ref 0–53)
AST: 22 U/L (ref 0–37)
Alkaline Phosphatase: 71 U/L (ref 39–117)
Bilirubin, Direct: 0 mg/dL (ref 0.0–0.3)
TOTAL PROTEIN: 7.6 g/dL (ref 6.0–8.3)
Total Bilirubin: 0.4 mg/dL (ref 0.2–1.2)

## 2014-03-09 LAB — BASIC METABOLIC PANEL
BUN: 11 mg/dL (ref 6–23)
CALCIUM: 10 mg/dL (ref 8.4–10.5)
CO2: 35 meq/L — AB (ref 19–32)
CREATININE: 1.2 mg/dL (ref 0.4–1.5)
Chloride: 100 mEq/L (ref 96–112)
GFR: 67.63 mL/min (ref >60.00)
GLUCOSE: 91 mg/dL (ref 70–99)
Potassium: 4 mEq/L (ref 3.5–5.1)
Sodium: 143 mEq/L (ref 135–145)

## 2014-03-09 LAB — TSH: TSH: 1.25 u[IU]/mL (ref 0.35–4.50)

## 2014-03-09 LAB — TESTOSTERONE: Testosterone: 245.94 ng/dL — ABNORMAL LOW (ref 300.00–890.00)

## 2014-03-09 LAB — T3, FREE: T3 FREE: 3.8 pg/mL (ref 2.3–4.2)

## 2014-03-09 LAB — VITAMIN B12: Vitamin B-12: 336 pg/mL (ref 211–911)

## 2014-03-09 LAB — T4, FREE: FREE T4: 0.81 ng/dL (ref 0.60–1.60)

## 2014-03-09 NOTE — Telephone Encounter (Signed)
emmi emailed °

## 2014-03-10 ENCOUNTER — Telehealth: Payer: Self-pay | Admitting: Family Medicine

## 2014-03-10 NOTE — Telephone Encounter (Signed)
Pt would like to try some type of testosterone therapy. You did recommend this due to recent lab results. Pt has already called his insurance company and he said that all of the medications would be the same co pay.

## 2014-03-10 NOTE — Telephone Encounter (Signed)
Call in Androgel 1.62% to apply 4 pumps daily, 6 month supply. Recheck a level in 6 months

## 2014-03-13 ENCOUNTER — Other Ambulatory Visit: Payer: BC Managed Care – PPO

## 2014-03-13 MED ORDER — TESTOSTERONE 20.25 MG/ACT (1.62%) TD GEL: TRANSDERMAL | Status: DC

## 2014-03-13 NOTE — Telephone Encounter (Signed)
I called in script and spoke with pt. 

## 2014-04-25 ENCOUNTER — Encounter: Payer: Self-pay | Admitting: Family Medicine

## 2014-04-25 ENCOUNTER — Ambulatory Visit (INDEPENDENT_AMBULATORY_CARE_PROVIDER_SITE_OTHER): Payer: BC Managed Care – PPO | Admitting: Family Medicine

## 2014-04-25 VITALS — BP 108/78 | HR 101 | Temp 98.9°F | Ht 70.0 in | Wt 187.0 lb

## 2014-04-25 DIAGNOSIS — J3089 Other allergic rhinitis: Secondary | ICD-10-CM

## 2014-04-25 DIAGNOSIS — K219 Gastro-esophageal reflux disease without esophagitis: Secondary | ICD-10-CM

## 2014-04-25 MED ORDER — MONTELUKAST SODIUM 10 MG PO TABS: 10.0000 mg | ORAL_TABLET | Freq: Every day | ORAL | Status: DC

## 2014-04-25 MED ORDER — OMEPRAZOLE 40 MG PO CPDR: 40.0000 mg | DELAYED_RELEASE_CAPSULE | Freq: Every day | ORAL | Status: DC

## 2014-04-25 NOTE — Progress Notes (Signed)
Pre visit review using our clinic review tool, if applicable. No additional management support is needed unless otherwise documented below in the visit note. 

## 2014-04-25 NOTE — Progress Notes (Signed)
   Subjective:    Patient ID: Christopher Solis, male    DOB: 1961-05-14, 53 y.o.   MRN: [id number]  HPI Here for 2 things. First his allergies have been difficult to control even with using Claritin daily. He still has itchy eyes and sneezing. He used Singulair in the past and wants to try this again. Also his acid reflux is worse, especially at night. He feels a burning sensation come up into the chest in bed at night and he tastes sour stomach contents in his mouth. No chest pain or SOB. Using TUMS.    Review of Systems  Constitutional: Negative.   HENT: Positive for postnasal drip, rhinorrhea and sneezing.   Respiratory: Negative.   Cardiovascular: Negative.   Gastrointestinal: Negative.        Objective:   Physical Exam  Constitutional: He appears well-developed and well-nourished.  HENT:  Right Ear: External ear normal.  Left Ear: External ear normal.  Nose: Nose normal.  Mouth/Throat: Oropharynx is clear and moist.  Eyes: Conjunctivae are normal.  Cardiovascular: Normal rate, regular rhythm, normal heart sounds and intact distal pulses.   Pulmonary/Chest: Effort normal and breath sounds normal.  Lymphadenopathy:    He has no cervical adenopathy.          Assessment & Plan:  Add Singulair to the Claritin. Try Prilosec every night before supper.

## 2014-05-01 ENCOUNTER — Encounter: Payer: Self-pay | Admitting: Family Medicine

## 2014-05-01 ENCOUNTER — Ambulatory Visit (INDEPENDENT_AMBULATORY_CARE_PROVIDER_SITE_OTHER): Payer: BC Managed Care – PPO | Admitting: Family Medicine

## 2014-05-01 VITALS — BP 115/78 | Temp 98.0°F | Wt 180.0 lb

## 2014-05-01 DIAGNOSIS — K529 Noninfective gastroenteritis and colitis, unspecified: Secondary | ICD-10-CM

## 2014-05-01 MED ORDER — CIPROFLOXACIN HCL 500 MG PO TABS: 500.0000 mg | ORAL_TABLET | Freq: Two times a day (BID) | ORAL | Status: DC

## 2014-05-01 MED ORDER — METRONIDAZOLE 500 MG PO TABS
500.0000 mg | ORAL_TABLET | Freq: Three times a day (TID) | ORAL | Status: DC
Start: 2014-05-01 — End: 2014-06-22

## 2014-05-01 NOTE — Progress Notes (Signed)
   Subjective:    Patient ID: Christopher Solis, male    DOB: 1960-07-04, 53 y.o.   MRN: [id number]  HPI Here for 3 days of lower abdominal cramps and diarrhea. No nausea or vomiting. This started suddenly over the weekend about 30 minutes after eating an egg and cheese breakfast sandwich from a fast food restaurant. He had sweats at first but no fever since then. We saw him recently for GERD sx and he started Prilosec. This has helped quite a bit.    Review of Systems  Constitutional: Positive for chills and diaphoresis. Negative for fever.  Gastrointestinal: Positive for abdominal pain and diarrhea. Negative for nausea, vomiting, constipation, blood in stool, abdominal distention, anal bleeding and rectal pain.       Objective:   Physical Exam  Constitutional: He appears well-developed and well-nourished.  Eyes: Conjunctivae are normal.  Abdominal: Soft. Bowel sounds are normal. He exhibits no distension and no mass. There is no tenderness. There is no rebound and no guarding.          Assessment & Plan:  Given Cipro and Flagyl. Add Align daily. Drink fluids. Written out of work today until 04-11-14.

## 2014-06-15 ENCOUNTER — Telehealth: Payer: Self-pay | Admitting: Family Medicine

## 2014-06-15 NOTE — Telephone Encounter (Signed)
FYI

## 2014-06-15 NOTE — Telephone Encounter (Signed)
Patient Name: Christopher Solis  DOB: 11/19/1960    Nurse Assessment  Nurse: Mallie Mussel, RN, Alveta Heimlich Date/Time Eilene Ghazi Time): 06/15/2014 9:17:37 AM  Confirm and document reason for call. If symptomatic, describe symptoms. ---Caller states that he has blood in his stool x 4 this morning. The toilet water has been red. He has passed blood without stool. He feels a little weak and has some nausea. This happened for the first time a month ago.  Has the patient traveled out of the country within the last 30 days? ---No  Does the patient require triage? ---Yes  Related visit to physician within the last 2 weeks? ---No  Does the PT have any chronic conditions? (i.e. diabetes, asthma, etc.) ---No     Guidelines    Guideline Title Affirmed Question Affirmed Notes  Rectal Bleeding [1] Large amount of blood AND (2) adult stable    Final Disposition User   Go to ED Now Mallie Mussel, RN, [place] does not want to go to ER to be seen. He wants to be seen by the doctor that did the last surgery for him. Advised him he would need to contact that office to be seen. He verbalized understanding.

## 2014-06-19 ENCOUNTER — Ambulatory Visit (INDEPENDENT_AMBULATORY_CARE_PROVIDER_SITE_OTHER): Payer: BLUE CROSS/BLUE SHIELD | Admitting: Family Medicine

## 2014-06-19 ENCOUNTER — Encounter: Payer: Self-pay | Admitting: Family Medicine

## 2014-06-19 VITALS — BP 101/72 | HR 95 | Temp 98.3°F | Ht 70.0 in | Wt 182.0 lb

## 2014-06-19 DIAGNOSIS — Z23 Encounter for immunization: Secondary | ICD-10-CM

## 2014-06-19 DIAGNOSIS — R197 Diarrhea, unspecified: Secondary | ICD-10-CM

## 2014-06-19 NOTE — Addendum Note (Signed)
Addended by: Aggie Hacker A on: 06/19/2014 03:37 PM   Modules accepted: Orders

## 2014-06-19 NOTE — Progress Notes (Signed)
Pre visit review using our clinic review tool, if applicable. No additional management support is needed unless otherwise documented below in the visit note. 

## 2014-06-19 NOTE — Progress Notes (Signed)
   Subjective:    Patient ID: Christopher Solis, male    DOB: 07/25/1960, 54 y.o.   MRN: [id number]  HPI Here with another flare of abdominal cramps and diarrhea. This started about a week ago. We saw him for a similar episode last Novemeber and gave him Cipro and Flagyl, but this did not help. The diarrhea eventually went away several weeks later. Now it is back. No dark stool, no vomiting, no fever. He had a recent flexible sigmoidoscopy per Dr. Cheryll Cockayne, the surgeon at [facility] who did his rectosigmoid surgery, and he said everything looked okay. His last full colonoscopy here in 2014 was clear except for the rectal lesions.    Review of Systems  Constitutional: Negative.   Gastrointestinal: Positive for abdominal pain and diarrhea. Negative for nausea, vomiting, constipation, blood in stool, abdominal distention, anal bleeding and rectal pain.       Objective:   Physical Exam  Constitutional: He appears well-developed and well-nourished.  Abdominal: Soft. Bowel sounds are normal. He exhibits no distension and no mass. There is no tenderness. There is no rebound and no guarding.          Assessment & Plan:  This does not seem to be infectious at this point. He may have inflammatory bowel disease. We will refer him back to GI for workup. He may use Imodium in the meantime.

## 2014-06-21 DIAGNOSIS — D375 Neoplasm of uncertain behavior of rectum: Secondary | ICD-10-CM | POA: Insufficient documentation

## 2014-06-22 ENCOUNTER — Ambulatory Visit (INDEPENDENT_AMBULATORY_CARE_PROVIDER_SITE_OTHER): Payer: BLUE CROSS/BLUE SHIELD | Admitting: Gastroenterology

## 2014-06-22 ENCOUNTER — Encounter: Payer: Self-pay | Admitting: Gastroenterology

## 2014-06-22 VITALS — BP 128/76 | HR 100 | Ht 68.75 in | Wt 184.5 lb

## 2014-06-22 DIAGNOSIS — D374 Neoplasm of uncertain behavior of colon: Secondary | ICD-10-CM

## 2014-06-22 DIAGNOSIS — K625 Hemorrhage of anus and rectum: Secondary | ICD-10-CM | POA: Insufficient documentation

## 2014-06-22 DIAGNOSIS — R194 Change in bowel habit: Secondary | ICD-10-CM | POA: Insufficient documentation

## 2014-06-22 NOTE — Progress Notes (Addendum)
     06/22/2014 Christopher Solis [id number] 07-16-1960   History of Present Illness:  This is a 54 year old male who is previously known to Dr. Sharlett Iles for colonoscopy in June 2014.  At that time he was found to have 6 polyps throughout the colon including a rectosigmoid mass that was circumferential and suspected to be malignant. Pathology of these areas showed sessile serrated adenomas, tubular adenomas, and hyperplastic polyps. 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 He was then referred for surgery to have this resected, but never had that surgery until February 2015. Surgery was performed at [facility] and he underwent a low anterior resection. Margins were clear and lymph nodes negative.  He presents to our office today with complaints of some bowel changes. He says that sometimes he will not have a bowel movement movement for a few days at a time and then when he finally does go it is very loose. He did have an episode last week with some diarrhea for several days and had some incontinence with that as well. He has seen some blood in his stool. He was seen by his physician at [facility] last week and actually had a flexible sigmoidoscopy at which time he was told that everything looked good. He is actually having a full colonoscopy at their facility next month as well. He says that the diarrhea has since resolved and he's had some normal bowel movements. He does take Metamucil daily.  He says that he just wanted to make sure that we didn't think there was something else going on.  Current Medications, Allergies, Past Medical History, Past Surgical History, Family History and Social History were reviewed in [employer] record.   Physical Exam: BP 128/76 mmHg  Pulse 100  Ht 5' 8.75" (1.746 m)  Wt 184 lb 8 oz (83.689 kg)  BMI 27.45 kg/m2 General: Well developed white male in no acute distress Head: Normocephalic  and atraumatic Eyes:  Sclerae anicteric, conjunctiva pink  Ears: Normal auditory acuity Lungs: Clear throughout to auscultation Heart: Regular rate and rhythm Abdomen: Soft, non-distended.  Low vertical abdominal incision noted.  Normal bowel sounds.  Non-tender. Musculoskeletal: Symmetrical with no gross deformities  Extremities: No edema  Neurological: Alert oriented x 4, grossly non-focal Psychological:  Alert and cooperative. Normal mood and affect  Assessment and Recommendations: -54 year old male with history of villous adenoma of the rectosigmoid colon that was surgically resected in 07/2013 at [facility], now with complaints of intermittent rectal bleeding and recent diarrhea, which has now resolved.  He had a flex sig by his physician at [facility] last week and is getting a full colonoscopy there again next month.  This diarrhea sounds like it could have been infectious or he may be having some erratic bowel habits/changes as a consequence of his surgery.  He takes Metamucil daily and will continue that.  He will consider adding daily Miralax to his regimen as well if continues with difficulty when moving his bowels.  Further recommendations from his GI MD in [place] given he is continues to follow closely there and has colonoscopy scheduled.  Addendum: Reviewed and agree with initial management. Jerene Bears, MD

## 2014-06-22 NOTE — Patient Instructions (Signed)
Continue metamucil and add miralax daily. CC:  Alysia Penna MD

## 2014-08-04 ENCOUNTER — Encounter: Payer: Self-pay | Admitting: Family Medicine

## 2014-08-04 ENCOUNTER — Ambulatory Visit (INDEPENDENT_AMBULATORY_CARE_PROVIDER_SITE_OTHER): Payer: BLUE CROSS/BLUE SHIELD | Admitting: Family Medicine

## 2014-08-04 VITALS — BP 112/68 | HR 104 | Temp 98.6°F | Ht 70.0 in | Wt 184.0 lb

## 2014-08-04 DIAGNOSIS — K589 Irritable bowel syndrome without diarrhea: Secondary | ICD-10-CM | POA: Insufficient documentation

## 2014-08-04 MED ORDER — DIPHENOXYLATE-ATROPINE 2.5-0.025 MG PO TABS: 2.0000 | ORAL_TABLET | Freq: Four times a day (QID) | ORAL | Status: DC | PRN

## 2014-08-04 NOTE — Progress Notes (Signed)
Pre visit review using our clinic review tool, if applicable. No additional management support is needed unless otherwise documented below in the visit note. 

## 2014-08-04 NOTE — Progress Notes (Signed)
   Subjective:    Patient ID: Christopher Solis, male    DOB: 06/07/1961, 54 y.o.   MRN: [id number]  HPI Here again to discuss his alternating bowel habits. He tends to have normal BMs for 3 or 4 weeks and then has a week of cramps and diarrhea. No fevers. He is already taking Metamucil and Align every day. He recently saw GI at [facility] and they performed a flexible sigmoidoscopy which was unremarkable. He does have a full colonoscopy comin up in the next few weeks. He knows to avoid fatty or spicy foods.    Review of Systems  Constitutional: Negative.   Gastrointestinal: Positive for abdominal pain, diarrhea and abdominal distention. Negative for nausea, vomiting, constipation, blood in stool, anal bleeding and rectal pain.       Objective:   Physical Exam  Constitutional: He appears well-developed and well-nourished.  Abdominal: Soft. Bowel sounds are normal. He exhibits no distension and no mass. There is no tenderness. There is no rebound and no guarding.          Assessment & Plan:  This is most consistent with IBS. He will try Lomotil qid as needed when the diarrhea and cramps occur.

## 2014-09-26 ENCOUNTER — Other Ambulatory Visit: Payer: Self-pay | Admitting: Family Medicine

## 2014-09-28 ENCOUNTER — Ambulatory Visit (INDEPENDENT_AMBULATORY_CARE_PROVIDER_SITE_OTHER): Payer: BLUE CROSS/BLUE SHIELD | Admitting: Family Medicine

## 2014-09-28 ENCOUNTER — Encounter: Payer: Self-pay | Admitting: Family Medicine

## 2014-09-28 VITALS — BP 128/78 | HR 103 | Temp 98.2°F | Wt 184.0 lb

## 2014-09-28 DIAGNOSIS — J018 Other acute sinusitis: Secondary | ICD-10-CM

## 2014-09-28 MED ORDER — DOXYCYCLINE HYCLATE 100 MG PO CAPS: 100.0000 mg | ORAL_CAPSULE | Freq: Two times a day (BID) | ORAL | Status: DC

## 2014-09-28 NOTE — Patient Instructions (Signed)
Follow up for any fever or worsening symptoms. 

## 2014-09-28 NOTE — Progress Notes (Signed)
   Subjective:    Patient ID: Christopher Solis, male    DOB: 12-Jul-1960, 54 y.o.   MRN: [id number]  HPI Patient seen for acute visit. He is a smoker who seen with one-week history of progressive productive cough. Had some increased sinus pressure and sinus headaches maxillary and frontal. No fever. Increased malaise. He's had some sweats off and on but no documented fever. Greenish nasal discharge. Intermittent productive cough. He takes Singulair and Allegra daily for allergies. No hemoptysis.  Past Medical History  Diagnosis Date  . Allergy   . Depression   . Osteoarthritis   . Low testosterone   . Colon polyps     adenomatous  . Rectal mass    Past Surgical History  Procedure Laterality Date  . Vasectomy    . Elbow surgery Right     reconstructive / right elbow  . Tonsillectomy    . Wisdom tooth extraction    . Colon surgery      removal of large polyp    reports that he has been smoking Cigarettes.  He has a 37 pack-year smoking history. He quit smokeless tobacco use about 34 years ago. He reports that he does not drink alcohol or use illicit drugs. family history includes Arthritis in his mother; Colon cancer in his other; Colon polyps in his father and mother; Coronary artery disease in his mother; Depression in his mother; Heart disease in his brother; Hyperlipidemia in his brother; Hypertension in his brother and mother; Stomach cancer in his paternal grandmother. Allergies  Allergen Reactions  . Amoxicillin     nausea  . Propoxyphene N-Acetaminophen     Causes migraine, Darvocet N      Review of Systems  Constitutional: Positive for fatigue.  HENT: Positive for congestion and sinus pressure.   Respiratory: Positive for cough.   Cardiovascular: Negative for chest pain.       Objective:   Physical Exam  Constitutional: He appears well-developed and well-nourished. No distress.  HENT:  Right Ear: External ear normal.  Left Ear: External ear normal.    Mouth/Throat: Oropharynx is clear and moist.  Neck: Neck supple.  Cardiovascular: Normal rate and regular rhythm.   Pulmonary/Chest: Effort normal and breath sounds normal. No respiratory distress. He has no wheezes. He has no rales.  Lymphadenopathy:    He has no cervical adenopathy.          Assessment & Plan:  Acute sinusitis. Doxycycline 100 mg twice daily for 10 days. Consider over-the-counter Mucinex. Increase hydration. Follow-up as needed. Work note given for today.

## 2014-09-28 NOTE — Progress Notes (Signed)
Pre visit review using our clinic review tool, if applicable. No additional management support is needed unless otherwise documented below in the visit note. 

## 2014-10-25 ENCOUNTER — Other Ambulatory Visit: Payer: Self-pay | Admitting: Family Medicine

## 2014-10-26 ENCOUNTER — Other Ambulatory Visit: Payer: Self-pay | Admitting: Family Medicine

## 2014-10-26 NOTE — Telephone Encounter (Signed)
Refill for 6 months. 

## 2014-11-25 ENCOUNTER — Other Ambulatory Visit: Payer: Self-pay | Admitting: Family Medicine

## 2015-01-02 ENCOUNTER — Encounter: Payer: Self-pay | Admitting: Family Medicine

## 2015-01-02 ENCOUNTER — Ambulatory Visit (INDEPENDENT_AMBULATORY_CARE_PROVIDER_SITE_OTHER): Payer: BLUE CROSS/BLUE SHIELD | Admitting: Family Medicine

## 2015-01-02 VITALS — BP 127/75 | HR 89 | Temp 98.6°F | Ht 70.0 in | Wt 192.0 lb

## 2015-01-02 DIAGNOSIS — E291 Testicular hypofunction: Secondary | ICD-10-CM | POA: Diagnosis not present

## 2015-01-02 NOTE — Progress Notes (Signed)
   Subjective:    Patient ID: Christopher Solis, male    DOB: 07-Sep-1960, 54 y.o.   MRN: [id number]  HPI Here asking about some generalized fatigue. He had complete labs last September and everything looked good except his testosterone was low. After he started giving himself the medication he felt better and had much more energy. Now he has stopped taking it for several  months and he is feeling tired again. No other sx.    Review of Systems  Constitutional: Positive for fatigue.  Respiratory: Negative.   Cardiovascular: Negative.   Gastrointestinal: Negative.   Endocrine: Negative.        Objective:   Physical Exam  Constitutional: He appears well-developed and well-nourished.  Neck: No thyromegaly present.  Cardiovascular: Normal rate, regular rhythm, normal heart sounds and intact distal pulses.   Pulmonary/Chest: Effort normal and breath sounds normal.  Lymphadenopathy:    He has no cervical adenopathy.          Assessment & Plan:  I think his fatigue is a direct result of his low testosterone level. He agreed to get back on the Androgel and we will follow up in 6 months

## 2015-01-02 NOTE — Progress Notes (Signed)
Pre visit review using our clinic review tool, if applicable. No additional management support is needed unless otherwise documented below in the visit note. 

## 2015-01-05 ENCOUNTER — Encounter: Payer: Self-pay | Admitting: Family Medicine

## 2015-01-05 ENCOUNTER — Ambulatory Visit (INDEPENDENT_AMBULATORY_CARE_PROVIDER_SITE_OTHER): Payer: BLUE CROSS/BLUE SHIELD | Admitting: Family Medicine

## 2015-01-05 VITALS — BP 130/70 | HR 98 | Temp 98.5°F | Ht 70.0 in | Wt 192.0 lb

## 2015-01-05 DIAGNOSIS — S43401A Unspecified sprain of right shoulder joint, initial encounter: Secondary | ICD-10-CM

## 2015-01-05 MED ORDER — OXYCODONE-ACETAMINOPHEN 10-325 MG PO TABS: 1.0000 | ORAL_TABLET | Freq: Three times a day (TID) | ORAL | Status: DC | PRN

## 2015-01-05 NOTE — Progress Notes (Signed)
   Subjective:    Patient ID: Christopher Solis, male    DOB: May 29, 1961, 54 y.o.   MRN: [id number]  HPI Here for pain in the right shoulder after an injury on 01-02-15. While he was attempting to slip his right arm into the strap of a backpack he felt and heard a loud "pop" and he felt a sharp pain in the shoulder. He has had pains in the posterior shoulder ever since. Using ice and heat.    Review of Systems  Constitutional: Negative.   Musculoskeletal: Positive for arthralgias.       Objective:   Physical Exam  Constitutional:  In pain  Musculoskeletal:  The right shoulder is very tender posteriorly but there is no crepitus. ROM is full           Assessment & Plan:  He may have actually partially dislocated the shoulder but it is in place now. He will rest it and use ice. Support the arm with a sling. Use Percocet for pain. Written out of work from 01-02-15 until 01-08-15.

## 2015-01-05 NOTE — Progress Notes (Signed)
Pre visit review using our clinic review tool, if applicable. No additional management support is needed unless otherwise documented below in the visit note. 

## 2015-01-25 ENCOUNTER — Other Ambulatory Visit: Payer: Self-pay | Admitting: Family Medicine

## 2015-02-01 ENCOUNTER — Telehealth: Payer: Self-pay | Admitting: Family Medicine

## 2015-02-01 NOTE — Telephone Encounter (Signed)
Pt has lesion on back and need removal.. Can I create 30 min slot next wk ?

## 2015-02-02 NOTE — Telephone Encounter (Signed)
Yes please schedule it

## 2015-02-02 NOTE — Telephone Encounter (Signed)
Pt has been sch

## 2015-02-06 ENCOUNTER — Ambulatory Visit: Payer: BLUE CROSS/BLUE SHIELD | Admitting: Family Medicine

## 2015-02-06 ENCOUNTER — Ambulatory Visit (INDEPENDENT_AMBULATORY_CARE_PROVIDER_SITE_OTHER): Payer: BLUE CROSS/BLUE SHIELD | Admitting: Family Medicine

## 2015-02-06 ENCOUNTER — Encounter: Payer: Self-pay | Admitting: Family Medicine

## 2015-02-06 VITALS — BP 132/74 | HR 90 | Temp 98.7°F | Ht 70.0 in | Wt 195.0 lb

## 2015-02-06 DIAGNOSIS — L989 Disorder of the skin and subcutaneous tissue, unspecified: Secondary | ICD-10-CM

## 2015-02-06 DIAGNOSIS — J019 Acute sinusitis, unspecified: Secondary | ICD-10-CM | POA: Diagnosis not present

## 2015-02-06 MED ORDER — AZITHROMYCIN 250 MG PO TABS
ORAL_TABLET | ORAL | Status: DC
Start: 2015-02-06 — End: 2015-02-26

## 2015-02-06 NOTE — Progress Notes (Signed)
   Subjective:    Patient ID: Christopher Solis, male    DOB: 07-14-1960, 54 y.o.   MRN: [id number]  HPI Here to remove a lesion on the middle of the back that we reviewed last time. He also thinks he as a sinus infection due to sinus pressure and pressure in the right ear. No fever. Using Sudafed.    Review of Systems  Constitutional: Negative.   HENT: Positive for congestion and sinus pressure. Negative for postnasal drip.   Eyes: Negative.   Respiratory: Negative.        Objective:   Physical Exam  Constitutional: He appears well-developed and well-nourished.  HENT:  Right Ear: External ear normal.  Left Ear: External ear normal.  Nose: Nose normal.  Mouth/Throat: Oropharynx is clear and moist.  Eyes: Conjunctivae are normal.  Neck: No thyromegaly present.  Pulmonary/Chest: Effort normal and breath sounds normal.  Lymphadenopathy:    He has no cervical adenopathy.  Skin:  There is a 1.3 cm nodular lesion on the center of the back          Assessment & Plan:  Treat the sinusitis with a Zpack. Written out of work today. We also removed the lesion on the back. The area was cleansed with Betadine and LA was achieved using 2% Xylocaine with epinephrine. An eliptical incision was made around the lesion down to the subcutaneous fat layer. The lesion was removed and sent to Pathology. The wound was closed using 5 sutures of 4-0 Ethilon. This was dressed with Neosporin and a Bandaid. He was instructed on care and the sutures will be removed in 14 days.

## 2015-02-06 NOTE — Progress Notes (Signed)
Pre visit review using our clinic review tool, if applicable. No additional management support is needed unless otherwise documented below in the visit note. 

## 2015-02-06 NOTE — Addendum Note (Signed)
Addended by: Aggie Hacker A on: 02/06/2015 04:23 PM   Modules accepted: Orders

## 2015-02-07 ENCOUNTER — Telehealth: Payer: Self-pay

## 2015-02-07 MED ORDER — ONDANSETRON HCL 8 MG PO TABS: 8.0000 mg | ORAL_TABLET | Freq: Four times a day (QID) | ORAL | Status: DC | PRN

## 2015-02-07 NOTE — Telephone Encounter (Signed)
Pt walked into the office today and states that the z-pak Dr. Sarajane Jews prescribed yesterday is causing nausea and pt was not able to go to work today due to this.  Per Dr. Sarajane Jews the z-pak is a less harsh abx and pt's symptoms may be from the sinus drainage.  Pt can have zofran 8 mg- take 1 tablet every 6 hours prn nausea #30 with 1 rf.  Discussed this with pt and he agrees to try the zofran and to contact the office if further assistance is needed.  Pt returned the work note from yesterday and a new one was given to return to work on 02/08/2015.

## 2015-02-07 NOTE — Addendum Note (Signed)
Addended by: Colleen Can on: 02/07/2015 12:41 PM   Modules accepted: Orders

## 2015-02-15 ENCOUNTER — Telehealth: Payer: Self-pay | Admitting: *Deleted

## 2015-02-15 DIAGNOSIS — L989 Disorder of the skin and subcutaneous tissue, unspecified: Secondary | ICD-10-CM

## 2015-02-15 NOTE — Telephone Encounter (Signed)
The referral was done  

## 2015-02-15 NOTE — Telephone Encounter (Signed)
Pt called and was wondering if Dr. Sarajane Jews can put in a referral to a dermatologist for him. Pt states that he has a mole on his Rt arm and Rt shoulder and the mole that is on his shoulder is irregular in shape. Pt is requesting a call back with an update. Please advise MD. Thanks

## 2015-02-15 NOTE — Telephone Encounter (Signed)
I spoke with pt  

## 2015-02-26 ENCOUNTER — Encounter: Payer: Self-pay | Admitting: Family Medicine

## 2015-02-26 ENCOUNTER — Ambulatory Visit (INDEPENDENT_AMBULATORY_CARE_PROVIDER_SITE_OTHER): Payer: BLUE CROSS/BLUE SHIELD | Admitting: Family Medicine

## 2015-02-26 VITALS — BP 132/76 | HR 94 | Temp 98.8°F | Ht 70.0 in | Wt 192.0 lb

## 2015-02-26 DIAGNOSIS — J019 Acute sinusitis, unspecified: Secondary | ICD-10-CM

## 2015-02-26 MED ORDER — LEVOFLOXACIN 500 MG PO TABS: 500.0000 mg | ORAL_TABLET | Freq: Every day | ORAL | Status: AC

## 2015-02-26 MED ORDER — CYCLOBENZAPRINE HCL 10 MG PO TABS: 10.0000 mg | ORAL_TABLET | Freq: Three times a day (TID) | ORAL | Status: DC | PRN

## 2015-02-26 NOTE — Progress Notes (Signed)
   Subjective:    Patient ID: Christopher Solis, male    DOB: Apr 06, 1961, 54 y.o.   MRN: [id number]  HPI Her for 4 weeks of sinus pressure, PND, HA, and a dry cough. He took a Zpack one week ago with no benefit.    Review of Systems  Constitutional: Negative.   HENT: Positive for congestion, postnasal drip and sinus pressure.   Eyes: Negative.   Respiratory: Positive for cough. Negative for shortness of breath and wheezing.   Cardiovascular: Positive for leg swelling. Negative for chest pain and palpitations.  Neurological: Negative.        Objective:   Physical Exam  Constitutional: He appears well-developed and well-nourished. No distress.  HENT:  Right Ear: External ear normal.  Left Ear: External ear normal.  Nose: Nose normal.  Mouth/Throat: Oropharynx is clear and moist.  Eyes: Conjunctivae are normal.  Neck: No thyromegaly present.  Pulmonary/Chest: Effort normal and breath sounds normal.  Lymphadenopathy:    He has no cervical adenopathy.          Assessment & Plan:  Probable sinusitis. Treat with Levaquin and Muciinex.

## 2015-02-26 NOTE — Progress Notes (Signed)
Pre visit review using our clinic review tool, if applicable. No additional management support is needed unless otherwise documented below in the visit note. 

## 2015-02-27 ENCOUNTER — Encounter: Payer: Self-pay | Admitting: *Deleted

## 2015-03-20 ENCOUNTER — Telehealth: Payer: Self-pay | Admitting: *Deleted

## 2015-03-20 NOTE — Telephone Encounter (Signed)
Dr Hilarie Fredrickson has reviewed patient's recall colonoscopy assessment form. Per Dr Hilarie Fredrickson, "was seen January 2016 by Alonza Bogus, PA-C and at [facility]. Was to have colonoscopy there ASAP. I do not see this was done and should be followed up. Please contact patient to ensure continuity of care. He expressed to [place] at last visit he will follow up at [facility].   I have contacted [facility] who states that they indeed last saw patient at 06/15/14 appointment. They attempted to contact patient on multiple occasions to schedule procedure. They left messages etc but never got a return call.  I have left a message for patient to call me to discuss.

## 2015-03-21 NOTE — Telephone Encounter (Signed)
I have spoken to Christopher Solis who states that he has been holding off on scheduling colonoscopy because he has been taking care of his mother. I advised that it is extremely important for him to follow through soon with colonoscopy due to his history of high grade dysplasia and increased risk of developing colon cancer. Patient verbalizes understanding, stating, "I know, I know." "I will call y'all back when I decide to schedule."

## 2015-03-22 NOTE — Telephone Encounter (Signed)
Noted  

## 2015-04-09 ENCOUNTER — Encounter: Payer: Self-pay | Admitting: Family Medicine

## 2015-04-09 ENCOUNTER — Ambulatory Visit (INDEPENDENT_AMBULATORY_CARE_PROVIDER_SITE_OTHER): Payer: BLUE CROSS/BLUE SHIELD | Admitting: Family Medicine

## 2015-04-09 VITALS — BP 130/84 | HR 102 | Temp 98.6°F | Ht 70.0 in | Wt 196.0 lb

## 2015-04-09 DIAGNOSIS — M545 Low back pain, unspecified: Secondary | ICD-10-CM

## 2015-04-09 MED ORDER — PREDNISONE 10 MG PO TABS: ORAL_TABLET | ORAL | Status: DC

## 2015-04-09 MED ORDER — HYDROCODONE-ACETAMINOPHEN 10-325 MG PO TABS
1.0000 | ORAL_TABLET | Freq: Three times a day (TID) | ORAL | Status: DC | PRN
Start: 2015-04-09 — End: 2015-05-24

## 2015-04-09 NOTE — Progress Notes (Signed)
   Subjective:    Patient ID: Christopher Solis, male    DOB: 1960-11-30, 54 y.o.   MRN: [id number]  HPI Here for several days of spasms and pain in the lower back. No recent trauma, he woke up with this pain. Using heat and Advil. He has used some Flexeril as well. No pain or numbness in the legs.    Review of Systems  Constitutional: Negative.   Musculoskeletal: Positive for back pain and gait problem.       Objective:   Physical Exam  Constitutional:  In pain   Musculoskeletal:  Tender in the lower back with spasms. Full ROM and negative SLR.          Assessment & Plan:  Low back pain. Treat with a steroid taper and Norco. Written out of work from 04-06-15 until 04-11-15.

## 2015-04-09 NOTE — Progress Notes (Signed)
Pre visit review using our clinic review tool, if applicable. No additional management support is needed unless otherwise documented below in the visit note. 

## 2015-04-11 ENCOUNTER — Telehealth: Payer: Self-pay | Admitting: Family Medicine

## 2015-04-11 NOTE — Telephone Encounter (Signed)
Pt call to ask that his note be extended. He said he is out of work today and will return  to work on 04/12/15

## 2015-04-11 NOTE — Telephone Encounter (Signed)
Per Dr. Sarajane Jews okay to give another note. I spoke with pt and note is ready for pick up.

## 2015-05-01 ENCOUNTER — Encounter: Payer: Self-pay | Admitting: Family Medicine

## 2015-05-01 ENCOUNTER — Ambulatory Visit (INDEPENDENT_AMBULATORY_CARE_PROVIDER_SITE_OTHER): Payer: BLUE CROSS/BLUE SHIELD | Admitting: Family Medicine

## 2015-05-01 VITALS — BP 136/88 | HR 121 | Temp 98.1°F | Ht 70.0 in | Wt 195.0 lb

## 2015-05-01 DIAGNOSIS — K591 Functional diarrhea: Secondary | ICD-10-CM | POA: Diagnosis not present

## 2015-05-01 MED ORDER — DIPHENOXYLATE-ATROPINE 2.5-0.025 MG PO TABS: 2.0000 | ORAL_TABLET | Freq: Four times a day (QID) | ORAL | Status: DC | PRN

## 2015-05-01 NOTE — Progress Notes (Signed)
Pre visit review using our clinic review tool, if applicable. No additional management support is needed unless otherwise documented below in the visit note. 

## 2015-05-01 NOTE — Progress Notes (Signed)
   Subjective:    Patient ID: Christopher Solis, male    DOB: [birth year], 54 y.o.   MRN: [id number]  HPI Here for continued bouts of diarrhea. His current bout started 5 days ago. He as no fever or nausea or cramps. He has used Lomotil with success.    Review of Systems  Constitutional: Negative.   Respiratory: Negative.   Cardiovascular: Negative.   Gastrointestinal: Positive for diarrhea. Negative for nausea, vomiting, abdominal pain, constipation, blood in stool, abdominal distention, anal bleeding and rectal pain.       Objective:   Physical Exam  Constitutional: He appears well-developed and well-nourished. No distress.  Cardiovascular: Normal rate, regular rhythm, normal heart sounds and intact distal pulses.   Pulmonary/Chest: Effort normal and breath sounds normal.  Abdominal: Soft. Bowel sounds are normal. He exhibits no distension and no mass. There is no tenderness. There is no rebound and no guarding.          Assessment & Plan:  Diarrhea, likely functional. Given more Lomotil to use prn. Refer back to GI

## 2015-05-07 ENCOUNTER — Encounter: Payer: Self-pay | Admitting: Gastroenterology

## 2015-05-19 ENCOUNTER — Other Ambulatory Visit: Payer: Self-pay | Admitting: Family Medicine

## 2015-05-24 ENCOUNTER — Encounter: Payer: Self-pay | Admitting: Gastroenterology

## 2015-05-24 ENCOUNTER — Ambulatory Visit (INDEPENDENT_AMBULATORY_CARE_PROVIDER_SITE_OTHER): Payer: BLUE CROSS/BLUE SHIELD | Admitting: Gastroenterology

## 2015-05-24 VITALS — BP 100/74 | HR 88 | Ht 68.75 in | Wt 197.1 lb

## 2015-05-24 DIAGNOSIS — K591 Functional diarrhea: Secondary | ICD-10-CM | POA: Diagnosis not present

## 2015-05-24 DIAGNOSIS — K635 Polyp of colon: Secondary | ICD-10-CM

## 2015-05-24 DIAGNOSIS — D126 Benign neoplasm of colon, unspecified: Secondary | ICD-10-CM

## 2015-05-24 DIAGNOSIS — Z9889 Other specified postprocedural states: Secondary | ICD-10-CM

## 2015-05-24 MED ORDER — NA SULFATE-K SULFATE-MG SULF 17.5-3.13-1.6 GM/177ML PO SOLN: ORAL | Status: DC

## 2015-05-24 NOTE — Addendum Note (Signed)
Addended by: Damian Leavell S on: 05/24/2015 11:14 AM   Modules accepted: Orders

## 2015-05-24 NOTE — Patient Instructions (Signed)
You have been scheduled for a colonoscopy. Please follow written instructions given to you at your visit today.  Please pick up your prep supplies at the pharmacy within the next 1-3 days. If you use inhalers (even only as needed), please bring them with you on the day of your procedure.  We have sent the following medications to your pharmacy for you to pick up at your convenience: Suprep  

## 2015-05-24 NOTE — Progress Notes (Signed)
HPI :  54 year old male here for follow up. He has a history of multiple polyps, including a very large tubulovillous adenoma requiring surgery. Colonoscopy in June 2014 showed 6 polyps throughout the colon including a rectosigmoid mass that was circumferential and suspected to be malignant. Pathology of these areas showed sessile serrated adenomas, tubular adenomas, and hyperplastic polyps. 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 He was then referred for surgery to have this resected which was done in February 2015. Surgery was performed at [facility] and he underwent a low anterior resection. Margins were clear and lymph nodes negative.  Since his surgery he generally has been doing okay. He has had some diarrhea intermittently, with urgency and loose stools that bother him, it comes and goes. He has roughly 2 BMs per week at this time however, using lomotil which works well for him. He has solid stools on this regimen, without it he has loose stools. No blood in the stools. No abdominal pains. He takes lomotil once daily.  He had a flex sig at [facility] due to rectal bleeding, shortly after the surgery although he is not sure when. He has not had a full colonoscopy since that time that is is aware of. He has a longstanding tobacco history. No first degree family history of colon cancer he is aware of.     Past Medical History  Diagnosis Date  . Allergy   . Depression   . Osteoarthritis   . Low testosterone   . Colon polyps     adenomatous  . Rectal mass     tubulovillous adenoma, s/p surgical resection     Past Surgical History  Procedure Laterality Date  . Vasectomy    . Elbow surgery Right     reconstructive / right elbow  . Tonsillectomy    . Wisdom tooth extraction    . Colon surgery      removal of large polyp   Family History  Problem Relation Age of Onset  . Arthritis Mother   . Coronary artery disease Mother    . Depression Mother   . Hyperlipidemia Brother   . Hypertension Mother   . Hypertension Brother   . Heart disease Brother   . Colon polyps Father   . Colon polyps Mother   . Colon cancer Other     maternal great grandmother  . Stomach cancer Paternal Grandmother    Social History  Substance Use Topics  . Smoking status: Current Every Day Smoker -- 37 years    Types: Cigarettes  . Smokeless tobacco: Former Systems developer    Quit date: 06/22/1980  . Alcohol Use: No   Current Outpatient Prescriptions  Medication Sig Dispense Refill  . cyclobenzaprine (FLEXERIL) 10 MG tablet Take 1 tablet (10 mg total) by mouth 3 (three) times daily as needed. for muscle spams 60 tablet 5  . diphenoxylate-atropine (LOMOTIL) 2.5-0.025 MG tablet Take 2 tablets by mouth 4 (four) times daily as needed for diarrhea or loose stools. 120 tablet 2  . loratadine (CLARITIN) 10 MG tablet Take 10 mg by mouth daily.    . montelukast (SINGULAIR) 10 MG tablet TAKE ONE TABLET BY MOUTH AT BEDTIME 30 tablet 11  . ondansetron (ZOFRAN) 8 MG tablet Take 1 tablet (8 mg total) by mouth every 6 (six) hours as needed for nausea or vomiting. 30 tablet 1  . Testosterone (ANDROGEL PUMP) 20.25 MG/ACT (1.62%) GEL Apply 4 application topically daily. 150 g  5  . VIAGRA 100 MG tablet TAKE ONE TABLET BY MOUTH ONCE DAILY AS NEEDED FOR  ERECTILE  DYSFUNCTION 10 tablet 11   No current facility-administered medications for this visit.   Allergies  Allergen Reactions  . Amoxicillin     nausea  . Propoxyphene N-Acetaminophen     Causes migraine, Darvocet N     Review of Systems: All systems reviewed and negative except where noted in HPI.   Lab Results  Component Value Date   WBC 10.4 03/08/2014   HGB 16.5 03/08/2014   HCT 48.7 03/08/2014   MCV 95.1 03/08/2014   PLT 401.0* 03/08/2014    Lab Results  Component Value Date   CREATININE 1.2 03/08/2014   BUN 11 03/08/2014   NA 143 03/08/2014   K 4.0 03/08/2014   CL 100 03/08/2014     CO2 35* 03/08/2014    Lab Results  Component Value Date   ALT 18 03/08/2014   AST 22 03/08/2014   ALKPHOS 71 03/08/2014   BILITOT 0.4 03/08/2014     Physical Exam: BP 100/74 mmHg  Pulse 88  Ht 5' 8.75" (1.746 m)  Wt 197 lb 2 oz (89.415 kg)  BMI 29.33 kg/m2 Constitutional: Pleasant,well-developed, male in no acute distress. HEENT: Normocephalic and atraumatic. Conjunctivae are normal. No scleral icterus. Neck supple.  Cardiovascular: Normal rate, regular rhythm.  Pulmonary/chest: Effort normal and breath sounds normal. No wheezing, rales or rhonchi. Abdominal: Soft, nondistended, nontender. Bowel sounds active throughout. There are no masses palpable. No hepatomegaly. Extremities: no edema Lymphadenopathy: No cervical adenopathy noted. Neurological: Alert and oriented to person place and time. Skin: Skin is warm and dry. No rashes noted. Psychiatric: Normal mood and affect. Behavior is normal.   ASSESSMENT AND PLAN: 54 y/o male with history of multiple adenomas including a large tubulovillous adenoma of the rectum, now s/p surgical resection. He has had some functional changes in his bowel following the surgery, with some intermittent loose stools with urgency, but overall doing well. He was started on lomotil which has helped his diarrhea significantly and happy taking one of these per day to control his bowels. At this time, given he has not had a post-operative colonoscopy following surgery for large tubulovillous adenoma that he is aware of, surgery which was roughly 3 years ago, recommend a surveillance colonoscopy at this time. We will obtain his records from [facility] to clarify findings of flex sig.   The indications, risks, and benefits of colonoscopy were explained to the patient in detail. Risks include but are not limited to bleeding, perforation, adverse reaction to medications, and cardiopulmonary compromise. Sequelae include but are not limited to the possibility  of surgery, hospitalization, and mortality. The patient verbalized understanding and wished to proceed. All questions answered, referred to the scheduler and bowel prep ordered. Further recommendations pending results of the exam.   [place] Cellar, MD [facility] Pager [phone]

## 2015-05-30 ENCOUNTER — Telehealth: Payer: Self-pay | Admitting: Gastroenterology

## 2015-05-30 NOTE — Telephone Encounter (Signed)
Received records from [facility] regarding this patient's endoscopic history. He had a rigid proctoscope on 06/15/24 which appeared normal. I don't see records of colonoscopy since his surgery. Will proceed with colonoscopy as previously discussed in clinic.

## 2015-06-14 ENCOUNTER — Encounter: Payer: Self-pay | Admitting: Gastroenterology

## 2015-06-14 ENCOUNTER — Ambulatory Visit (AMBULATORY_SURGERY_CENTER): Payer: BLUE CROSS/BLUE SHIELD | Admitting: Gastroenterology

## 2015-06-14 VITALS — BP 113/72 | HR 78 | Temp 97.4°F | Resp 12 | Ht 68.75 in | Wt 197.0 lb

## 2015-06-14 DIAGNOSIS — Z86010 Personal history of colonic polyps: Secondary | ICD-10-CM

## 2015-06-14 DIAGNOSIS — D128 Benign neoplasm of rectum: Secondary | ICD-10-CM

## 2015-06-14 DIAGNOSIS — D122 Benign neoplasm of ascending colon: Secondary | ICD-10-CM

## 2015-06-14 DIAGNOSIS — D123 Benign neoplasm of transverse colon: Secondary | ICD-10-CM | POA: Diagnosis not present

## 2015-06-14 DIAGNOSIS — K635 Polyp of colon: Secondary | ICD-10-CM | POA: Diagnosis not present

## 2015-06-14 DIAGNOSIS — D129 Benign neoplasm of anus and anal canal: Secondary | ICD-10-CM

## 2015-06-14 DIAGNOSIS — D120 Benign neoplasm of cecum: Secondary | ICD-10-CM

## 2015-06-14 HISTORY — PX: COLONOSCOPY: SHX174

## 2015-06-14 MED ORDER — SODIUM CHLORIDE 0.9 % IV SOLN: 500.0000 mL | INTRAVENOUS | Status: DC

## 2015-06-14 NOTE — Op Note (Signed)
[facility]  [address] [place], [address]   COLONOSCOPY PROCEDURE REPORT  PATIENT: Christopher Solis, Christopher Solis  MR#: [id number] BIRTHDATE: 01/23/1961 , 51  yrs. old GENDER: male ENDOSCOPIST: Yetta Flock, MD REFERRED BY: PROCEDURE DATE:  06/14/2015 PROCEDURE:   Colonoscopy, surveillance , Colonoscopy with snare polypectomy, and Colonoscopy with biopsy First Screening Colonoscopy - Avg.  risk and is 50 yrs.  old or older - No.  Prior Negative Screening - Now for repeat screening. N/A  History of Adenoma - Now for follow-up colonoscopy & has been > or = to 3 yrs.  No.  It has been less than 3 yrs since last colonoscopy.  Other: See Comments  Polyps removed today? Yes ASA CLASS:   Class II INDICATIONS:Surveillance due to prior colonic neoplasia and Colorectal Neoplasm Risk Assessment for this procedure is average risk. MEDICATIONS: Propofol 500 mg IV   , glucagon 1mg  IV, lidocaine 160mg  IV  DESCRIPTION OF PROCEDURE:   After the risks benefits and alternatives of the procedure were thoroughly explained, informed consent was obtained.  The digital rectal exam revealed no abnormalities of the rectum.   The LB [id number]  endoscope was introduced through the anus and advanced to the cecum, which was identified by both the appendix and ileocecal valve. No adverse events experienced.   The quality of the prep was adequate  The instrument was then slowly withdrawn as the colon was fully examined. Estimated blood loss is zero unless otherwise noted in this procedure report.     COLON FINDINGS: A 27mm sessile cecal polyp was noted behind the IC valve, and removed via cold snare.  Two ascending colon sessile polyps, ranging from 6-78mm in size, we removed with cold snare. Six sessile transverse polyps were noted ranging from 4-8mm in size, and removed with cold forceps.  Two sessile rectal polyps, roughly 3-27mm in size, were noted and removed via cold  snare. Another 3-9mm sessile polyp was not able to be grasped with the cold snare and removed with cold forceps.  The surgical anastomosis was normal.  The colon was quite spastic and the patient did not retain air well, led to glucagon administration and a proolonged procedure time.  The remainder of the examined colon.  Retroflexion was not performed due to a narrow rectal vault. The time to cecum = 4.1 Withdrawal time = 46.5   The scope was withdrawn and the procedure completed. COMPLICATIONS: There were no immediate complications.  ENDOSCOPIC IMPRESSION: Multiple colon polyps removed as described above Spastic colon  RECOMMENDATIONS: No NSAIDs for 2 weeks Await pathology results Resume diet Resume medications  eSigned:  Yetta Flock, MD 06/14/2015 3:59 PM   cc: the patient   PATIENT NAME:  Christopher Solis, Christopher Solis MR#: [id number]

## 2015-06-14 NOTE — Progress Notes (Addendum)
Patient stated that he would"never quite !@#$%^ smoking!  He stated that he was going to do what he wanted, and he's just have to "die tryin'."  I explained about polyps and smoking.  He told me that it was his only vice.

## 2015-06-14 NOTE — Patient Instructions (Signed)
YOU HAD AN ENDOSCOPIC PROCEDURE TODAY AT [facility]:   Refer to the procedure report that was given to you for any specific questions about what was found during the examination.  If the procedure report does not answer your questions, please call your gastroenterologist to clarify.  If you requested that your care partner not be given the details of your procedure findings, then the procedure report has been included in a sealed envelope for you to review at your convenience later.  YOU SHOULD EXPECT: Some feelings of bloating in the abdomen. Passage of more gas than usual.  Walking can help get rid of the air that was put into your GI tract during the procedure and reduce the bloating. If you had a lower endoscopy (such as a colonoscopy or flexible sigmoidoscopy) you may notice spotting of blood in your stool or on the toilet paper. If you underwent a bowel prep for your procedure, you may not have a normal bowel movement for a few days.  Please Note:  You might notice some irritation and congestion in your nose or some drainage.  This is from the oxygen used during your procedure.  There is no need for concern and it should clear up in a day or so.  SYMPTOMS TO REPORT IMMEDIATELY:   Following lower endoscopy (colonoscopy or flexible sigmoidoscopy):  Excessive amounts of blood in the stool  Significant tenderness or worsening of abdominal pains  Swelling of the abdomen that is new, acute  Fever of 100F or higher   For urgent or emergent issues, a gastroenterologist can be reached at any hour by calling [phone].   DIET: Your first meal following the procedure should be a small meal and then it is ok to progress to your normal diet. Heavy or fried foods are harder to digest and may make you feel nauseous or bloated.  Likewise, meals heavy in dairy and vegetables can increase bloating.  Drink plenty of fluids but you should avoid alcoholic beverages for 24  hours.  ACTIVITY:  You should plan to take it easy for the rest of today and you should NOT DRIVE or use heavy machinery until tomorrow (because of the sedation medicines used during the test).    FOLLOW UP: Our staff will call the number listed on your records the next business day following your procedure to check on you and address any questions or concerns that you may have regarding the information given to you following your procedure. If we do not reach you, we will leave a message.  However, if you are feeling well and you are not experiencing any problems, there is no need to return our call.  We will assume that you have returned to your regular daily activities without incident.  If any biopsies were taken you will be contacted by phone or by letter within the next 1-3 weeks.  Please call us at [phone] if you have not heard about the biopsies in 3 weeks.    SIGNATURES/CONFIDENTIALITY: You and/or your care partner have signed paperwork which will be entered into your electronic medical record.  These signatures attest to the fact that that the information above on your After Visit Summary has been reviewed and is understood.  Full responsibility of the confidentiality of this discharge information lies with you and/or your care-partner.  No advil nor ibuprofen, aspirin for two weeks per Dr. Havery Moros.    STOP SMOKING. SMOKERS have a higher incidence of polyps thus  a higher chance of colon cancer.   Read all handouts given to you by your recovery room nurse.

## 2015-06-14 NOTE — Progress Notes (Signed)
Report to PACU, RN, vss, BBS= Clear.  

## 2015-06-14 NOTE — Progress Notes (Signed)
Called to room to assist during endoscopic procedure.  Patient ID and intended procedure confirmed with present staff. Received instructions for my participation in the procedure from the performing physician.  

## 2015-06-15 ENCOUNTER — Telehealth: Payer: Self-pay

## 2015-06-15 NOTE — Telephone Encounter (Signed)
  Follow up Call-  Call back number 06/14/2015 11/29/2012  Post procedure Call Back phone  # [phone]  Permission to leave phone message Yes Yes     Patient questions:  Do you have a fever, pain , or abdominal swelling? No. Pain Score  0 *  Have you tolerated food without any problems? Yes.    Have you been able to return to your normal activities? Yes.    Do you have any questions about your discharge instructions: Diet   No. Medications  No. Follow up visit  No.  Do you have questions or concerns about your Care? No.  Actions: * If pain score is 4 or above: No action needed, pain <4.  No Problems per the pt. maw

## 2015-06-18 ENCOUNTER — Ambulatory Visit (INDEPENDENT_AMBULATORY_CARE_PROVIDER_SITE_OTHER): Payer: BLUE CROSS/BLUE SHIELD | Admitting: Family Medicine

## 2015-06-18 ENCOUNTER — Encounter: Payer: Self-pay | Admitting: Family Medicine

## 2015-06-18 ENCOUNTER — Other Ambulatory Visit: Payer: Self-pay | Admitting: Family Medicine

## 2015-06-18 VITALS — BP 118/77 | HR 114 | Temp 97.9°F | Ht 68.75 in | Wt 191.0 lb

## 2015-06-18 DIAGNOSIS — M5440 Lumbago with sciatica, unspecified side: Secondary | ICD-10-CM

## 2015-06-18 MED ORDER — PREDNISONE 10 MG PO TABS: ORAL_TABLET | ORAL | Status: DC

## 2015-06-18 MED ORDER — HYDROCODONE-ACETAMINOPHEN 10-325 MG PO TABS: 1.0000 | ORAL_TABLET | Freq: Four times a day (QID) | ORAL | Status: DC | PRN

## 2015-06-18 MED ORDER — KETOROLAC TROMETHAMINE 60 MG/2ML IM SOLN
60.0000 mg | Freq: Once | INTRAMUSCULAR | Status: AC
  Administered 2015-06-18: 60 mg via INTRAMUSCULAR

## 2015-06-18 NOTE — Addendum Note (Signed)
Addended by: Aggie Hacker A on: 06/18/2015 01:47 PM   Modules accepted: Orders

## 2015-06-18 NOTE — Progress Notes (Signed)
   Subjective:    Patient ID: Christopher Solis, male    DOB: 02/06/1961, 55 y.o.   MRN: [id number]  HPI Here with recurrent severe low back pain. He had a colonoscopy last week, and the following morning he awoke with severe pain in the center of the back that radiated down both legs to the calves. He has used heat, Flexeril, and Aleve with no relief. He had a plan Xray of the lumbar spine in 2010 showing a degenerative disc at L3-4.    Review of Systems  Constitutional: Negative.   Musculoskeletal: Positive for back pain.       Objective:   Physical Exam  Constitutional: He appears well-developed and well-nourished.  In pain  Musculoskeletal:  Very tender in the lower back over the spine, lots of bilateral spasm. ROM is reduced due to pain, SLR are positive bilaterally.           Assessment & Plan:  Low back pain, suspect a herniated disc. Given a prednisone taper, a Toradol shot, Flexeril and Norco. Written out of work today until 06-25-15. Set up an MRI soon.

## 2015-06-18 NOTE — Progress Notes (Signed)
Pre visit review using our clinic review tool, if applicable. No additional management support is needed unless otherwise documented below in the visit note. 

## 2015-06-26 ENCOUNTER — Encounter: Payer: Self-pay | Admitting: Family Medicine

## 2015-06-26 ENCOUNTER — Ambulatory Visit (INDEPENDENT_AMBULATORY_CARE_PROVIDER_SITE_OTHER): Payer: BLUE CROSS/BLUE SHIELD | Admitting: Family Medicine

## 2015-06-26 VITALS — BP 128/80 | HR 106 | Temp 98.2°F | Ht 68.75 in | Wt 196.0 lb

## 2015-06-26 DIAGNOSIS — R309 Painful micturition, unspecified: Secondary | ICD-10-CM | POA: Diagnosis not present

## 2015-06-26 DIAGNOSIS — M545 Low back pain, unspecified: Secondary | ICD-10-CM

## 2015-06-26 LAB — POCT URINALYSIS DIPSTICK
Bilirubin, UA: NEGATIVE
Glucose, UA: NEGATIVE
KETONES UA: NEGATIVE
Leukocytes, UA: NEGATIVE
Nitrite, UA: NEGATIVE
PH UA: 5.5
PROTEIN UA: NEGATIVE
RBC UA: NEGATIVE
Urobilinogen, UA: 0.2

## 2015-06-26 NOTE — Progress Notes (Signed)
   Subjective:    Patient ID: Christopher Solis, male    DOB: [birth year], 55 y.o.   MRN: [id number]  HPI We have been seeing him for severe low back pain and he is scheduled for an MRI tomorrow. However he woke up this morning with right flank pains and the urge to urinate. No blood was seen, no fever. He is drinking plenty of water.    Review of Systems  Constitutional: Negative.   Respiratory: Negative.   Cardiovascular: Negative.   Gastrointestinal: Negative.   Genitourinary: Positive for urgency and flank pain. Negative for dysuria, frequency and hematuria.  Musculoskeletal: Positive for back pain.       Objective:   Physical Exam  Constitutional: He appears well-developed and well-nourished.  In pain  Cardiovascular: Normal rate, regular rhythm, normal heart sounds and intact distal pulses.   Pulmonary/Chest: Effort normal and breath sounds normal.  Abdominal: Soft. Bowel sounds are normal. He exhibits no distension and no mass. There is no rebound and no guarding.  Tender in the right flank   Musculoskeletal:  Tender in the lower back with lots of spasm in the right lower back           Assessment & Plan:  Low back pain. His UA to day is clear. The flank pain is the result of radiating pain from the spine. Rest, heat, pain meds prn. Await the scan tomorrow.

## 2015-06-26 NOTE — Progress Notes (Signed)
Pre visit review using our clinic review tool, if applicable. No additional management support is needed unless otherwise documented below in the visit note. 

## 2015-06-27 ENCOUNTER — Ambulatory Visit
Admission: RE | Admit: 2015-06-27 | Discharge: 2015-06-27 | Disposition: A | Discharge status: Home / Self Care | Payer: BLUE CROSS/BLUE SHIELD | Source: Ambulatory Visit | Attending: Family Medicine | Admitting: Family Medicine

## 2015-06-27 DIAGNOSIS — M5440 Lumbago with sciatica, unspecified side: Secondary | ICD-10-CM

## 2015-06-29 ENCOUNTER — Telehealth: Payer: Self-pay | Admitting: Family Medicine

## 2015-06-29 NOTE — Telephone Encounter (Signed)
Patient notified of MRI results. Patient states that he would like to return to work Monday 1/23 - ok'd per Dr. Sarajane Jews. Patient notified.

## 2015-06-29 NOTE — Addendum Note (Signed)
Addended by: Alysia Penna A on: 06/29/2015 05:26 PM   Modules accepted: Orders

## 2015-06-29 NOTE — Telephone Encounter (Signed)
Pt would like mri results. Pt would like to know if he is able to return back to work

## 2015-07-04 ENCOUNTER — Ambulatory Visit: Payer: BLUE CROSS/BLUE SHIELD | Attending: Family Medicine

## 2015-07-04 ENCOUNTER — Other Ambulatory Visit: Payer: Self-pay | Admitting: Family Medicine

## 2015-07-04 DIAGNOSIS — M545 Low back pain, unspecified: Secondary | ICD-10-CM

## 2015-07-04 DIAGNOSIS — M25659 Stiffness of unspecified hip, not elsewhere classified: Secondary | ICD-10-CM | POA: Insufficient documentation

## 2015-07-04 NOTE — Therapy (Signed)
[facility] [address], [place], [place], [address] Phone: [phone]   Fax:  [phone]  Physical Therapy Evaluation  Patient Details  Name: Christopher Solis MRN: [id number] Date of Birth: 03-25-1961 Referring Provider: Alysia Penna, MD  Encounter Date: 07/04/2015      PT End of Session - 07/04/15 1559    Visit Number 1   Date for PT Re-Evaluation 08/29/15   PT Start Time 1532   PT Stop Time 1609   PT Time Calculation (min) 37 min   Activity Tolerance Patient tolerated treatment well   Behavior During Therapy [facility] for tasks assessed/performed      Past Medical History  Diagnosis Date  . Allergy   . Depression   . Osteoarthritis   . Low testosterone   . Colon polyps     adenomatous  . Rectal mass     tubulovillous adenoma, s/p surgical resection    Past Surgical History  Procedure Laterality Date  . Vasectomy    . Elbow surgery Right     reconstructive / right elbow  . Tonsillectomy    . Wisdom tooth extraction    . Colon surgery      removal of large polyp    There were no vitals filed for this visit.  Visit Diagnosis:  Right-sided low back pain without sciatica - Plan: PT plan of care cert/re-cert  Stiffness of hip joint, unspecified laterality - Plan: PT plan of care cert/re-cert      Subjective Assessment - 07/04/15 1530    Subjective Pt presents to PT with complaints of LBP that began the day after having colonoscopy procedure ~2 weeks ago.  Pt was signed out of work due to severity of pain. Pt has now returned to work.  Recent MRI showed mild disc and facet degeneration without impingement.     Diagnostic tests MRI: 06/27/15 mild disc and facet degeneration without impngement.   Patient Stated Goals reduce LBP   Currently in Pain? Yes   Pain Score 5   no pain today   Pain Location Back   Pain Orientation Right;Lower   Pain Descriptors / Indicators Shooting;Aching   Pain Type Acute  pain   Pain Onset 1 to 4 weeks ago   Pain Frequency Intermittent   Aggravating Factors  bending over, getting up from floor (at work), after work    Pain Relieving Factors muscle relaxer, hot bath   Effect of Pain on Daily Activities pain with work or after work            [facility] PT Assessment - 07/04/15 0001    Assessment   Medical Diagnosis Rt sided low back pain without sciatica (M54.5)   Referring Provider Alysia Penna, MD   Onset Date/Surgical Date 06/20/15   Next MD Visit none   Prior Therapy none   Precautions   Precautions None   Restrictions   Weight Bearing Restrictions No   Balance Screen   Has the patient fallen in the past 6 months No   Has the patient had a decrease in activity level because of a fear of falling?  No   Is the patient reluctant to leave their home because of a fear of falling?  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  Lt lumbar AROM rotation is limited by 25% vs the Lt   PROM   Overall PROM  Deficits   Overall PROM Comments bil. hip IR/ER is limited by 25% without pain.  Hamstring length is normal and Rt=Lt   Strength   Overall Strength Within functional limits for tasks performed   Overall Strength Comments 5/5 bilateral LE strength   Palpation   Spinal  mobility PA mobility in thoracic and lumbar spine limited by 50% without pain.     Palpation comment tension and stiffness in bilateral thoracic and lumbar parapsinals.  Moderate tenderness reported over Lt lumbar paraspinals L3-5 and quadratus lumborum   Ambulation/Gait   Ambulation/Gait Yes   Ambulation/Gait Assistance 7: Independent   Gait Pattern Within Functional Limits                           PT Education - 07/04/15 1558    Education provided Yes   Education Details HEP: lumbar and hip flexibility   Person(s) Educated Patient   Methods Explanation;Demonstration;Handout   Comprehension Verbalized understanding;Returned demonstration          PT Short Term Goals - 07/04/15 1606    PT SHORT TERM GOAL #1   Title be independent in initial HEP   Time 4   Period Weeks   Status New   PT SHORT TERM GOAL #2   Title report < or = to 2/10 LBP at the end of a work shift   Time 4   Period Weeks   Status New           PT Long Term Goals - 07/04/15 1533    PT LONG TERM GOAL #1   Title be independent in advanced HEP   Time 8   Period Weeks   Status New   PT LONG TERM GOAL #2   Title reduce FOTO to < or = to 25% limitation   Time 8   Period Weeks   Status New   PT LONG TERM GOAL #3   Title verbalize and demonstrate body mechanics modifications for home and work tasks to protect lumbar spine   Time 8   Period Weeks   Status New   PT LONG TERM GOAL #4   Title report a 75% reduction in LBP with work tasks   Time 8   Period Weeks   Status New               Plan - 07/04/15 1559    Clinical Impression Statement Pt presents to PT ~2 weeks s/p acute onset of Rt sided LBP.  MRI was negative.  Pt presents with reduced thoracic and lumbar mobility, muscle tension bilaterally, limited hip IR/ER and palpable tenderness over Rt lumbar spine and quadratus.  Pt with up to 5/10 Rt sided LBP with work activities.  Pt will benefit from skilled PT for manual,  flexibility, modatlies PRN, body mechanics and core strength.     Pt will benefit from  skilled therapeutic intervention in order to improve on the following deficits Postural dysfunction;Hypomobility;Impaired flexibility;Improper body mechanics;Pain;Decreased range of motion   Rehab Potential Good   PT Frequency 2x / week   PT Duration 8 weeks   PT Treatment/Interventions ADLs/Self Care Home Management;Cryotherapy;Electrical Stimulation;Moist Heat;Therapeutic exercise;Therapeutic activities;Functional mobility training;Ultrasound;Neuromuscular re-education;Patient/family education;Manual techniques;Dry needling;Passive range of motion   PT Next Visit Plan body mechanics education, core strength, manual/modalities to lumbar spine, flexibility   Consulted and Agree with Plan of Care Patient         Problem List Patient Active Problem List   Diagnosis Date Noted  . Hypogonadism in male 01/02/2015  . IBS (irritable bowel syndrome) 08/04/2014  . Rectal bleeding 06/22/2014  . Change in bowel habits 06/22/2014  . Villous adenoma of rectum 06/21/2014  . GERD (gastroesophageal reflux disease) 04/25/2014  . Wart 03/08/2014  . Strain of hip flexor 05/04/2013  . Villous adenoma of rectosigmoid 12/09/2012  . DEPRESSION 05/07/2010  . Allergic rhinitis 05/07/2010  . OSTEOARTHRITIS 05/07/2010    Michelina Mexicano, PT 07/04/2015, 4:10 PM  [facility] [address] [place], [place], [address] Phone: [phone]   Fax:  [phone]  Name: Christopher Solis MRN: [id number] Date of Birth: 06-09-1961

## 2015-07-04 NOTE — Telephone Encounter (Signed)
Can we refill this? Not on current medication list.

## 2015-07-04 NOTE — Patient Instructions (Addendum)
Perform all exercises below:  Hold _20___ seconds. Repeat _3___ times.  Do __3__ sessions per day. CAUTION: Movement should be gentle, steady and slow.  Knee to Chest  Lying supine, bend involved knee to chest. Perform with each leg.  Copyright  VHI. All rights reserved.  Supine With Rotation    Lie, back flat, legs bent, feet together. Rotate knees to one side. Hold _20__ seconds. Repeat to other side. Repeat _3__ times per session. Do 3___ sessions per day.  Copyright  VHI. All rights reserved.     HIP: Hamstrings - Short Sitting   Rest leg on raised surface. Keep knee straight. Lift chest.   Piriformis Stretch, Sitting    Sit, one ankle on opposite knee, same-side hand on crossed knee. Push down on knee, keeping spine straight. Lean torso forward, with flat back, until tension is felt in hamstrings and gluteals of crossed-leg side. Hold 20___ seconds.  Repeat 3___ times per session. Do _3__ sessions per day.  Copyright  VHI. All rights reserved.    [facility] [address] [place], [place] [address] Phone # [phone] Fax [phone]

## 2015-07-11 ENCOUNTER — Ambulatory Visit: Payer: BLUE CROSS/BLUE SHIELD | Attending: Family Medicine

## 2015-07-11 DIAGNOSIS — M545 Low back pain, unspecified: Secondary | ICD-10-CM

## 2015-07-11 DIAGNOSIS — M25659 Stiffness of unspecified hip, not elsewhere classified: Secondary | ICD-10-CM | POA: Insufficient documentation

## 2015-07-11 NOTE — Therapy (Addendum)
[facility] [address] [place], [place], [address] Phone: [phone]   Fax:  [phone]  Physical Therapy Treatment  Patient Details  Name: Christopher Solis MRN: [id number] Date of Birth: 1960-06-21 Referring Provider: Alysia Penna, MD  Encounter Date: 07/11/2015      PT End of Session - 07/11/15 1616    Visit Number 2   Date for PT Re-Evaluation 08/29/15   PT Start Time 9774   PT Stop Time 1632   PT Time Calculation (min) 41 min   Activity Tolerance Patient tolerated treatment well   Behavior During Therapy [facility] for tasks assessed/performed      Past Medical History  Diagnosis Date  . Allergy   . Depression   . Osteoarthritis   . Low testosterone   . Colon polyps     adenomatous  . Rectal mass     tubulovillous adenoma, s/p surgical resection    Past Surgical History  Procedure Laterality Date  . Vasectomy    . Elbow surgery Right     reconstructive / right elbow  . Tonsillectomy    . Wisdom tooth extraction    . Colon surgery      removal of large polyp    There were no vitals filed for this visit.  Visit Diagnosis:  Right-sided low back pain without sciatica  Stiffness of hip joint, unspecified laterality      Subjective Assessment - 07/11/15 1553    Subjective Pt reports that he feels "tight" today.  He has been doing stretches at home.     Currently in Pain? Yes   Pain Score 0-No pain  up to 4/10 at work   Pain Location Back   Pain Orientation Lower;Right   Pain Descriptors / Indicators Aching;Shooting   Pain Type Acute pain   Pain Onset 1 to 4 weeks ago   Pain Frequency Intermittent   Aggravating Factors  bending over, getting up from the floor (at work), after work    Pain Relieving Factors hot bath, muscle relaxer                         [facility] Adult PT Treatment/Exercise - 07/11/15 0001    Exercises   Exercises Lumbar;Knee/Hip   Lumbar Exercises: Stretches    Active Hamstring Stretch 3 reps;20 seconds  seated and supine with strap   Single Knee to Chest Stretch 3 reps;20 seconds   Lower Trunk Rotation 3 reps;20 seconds   Piriformis Stretch 3 reps;20 seconds   Modalities   Modalities Ultrasound   Ultrasound   Ultrasound Location bil lumbar paraspinals   Ultrasound Parameters 1.3 w/cm2 cont to bil lumbar paraspinals   Ultrasound Goals Pain;Other (Comment)  stiffness                PT Education - 07/11/15 1603    Education provided Yes   Education Details body mechanics education   Person(s) Educated Patient   Methods Demonstration;Explanation;Handout   Comprehension Verbalized understanding;Returned demonstration          PT Short Term Goals - 07/11/15 1557    PT SHORT TERM GOAL #1   Title be independent in initial HEP   Time 4   Period Weeks   Status On-going  just issued last session   PT SHORT TERM GOAL #2   Title report < or = to 2/10 LBP at the end of a work shift   Time 4  Period Weeks   Status On-going  up to 4/10           PT Long Term Goals - 07/11/15 1557    PT LONG TERM GOAL #3   Title verbalize and demonstrate body mechanics modifications for home and work tasks to protect lumbar spine   Status Achieved               Plan - 07/11/15 1558    Clinical Impression Statement Pt with only 1 session after evaluation.  Pt is independent in HEP for flexibility and reports compliance with this as able at home and work.  Pt has received body mechanics education today for lumbar protection.  Pt with continued decreased mobility in the lumbar and thoracic spine, hip stiffness and palplable tenderness over Rt lumbar spine and quadratus.  Pt will benefit from skilled PT for manual, modalities, flexibility and core strength.     Pt will benefit from skilled therapeutic intervention in order to improve on the following deficits Postural dysfunction;Hypomobility;Impaired flexibility;Improper body  mechanics;Pain;Decreased range of motion   Rehab Potential Good   PT Frequency 2x / week   PT Duration 8 weeks   PT Treatment/Interventions ADLs/Self Care Home Management;Cryotherapy;Electrical Stimulation;Moist Heat;Therapeutic exercise;Therapeutic activities;Functional mobility training;Ultrasound;Neuromuscular re-education;Patient/family education;Manual techniques;Dry needling;Passive range of motion   PT Next Visit Plan core strength, manual/modalities to lumbar spine, flexibility   Consulted and Agree with Plan of Care Patient        Problem List Patient Active Problem List   Diagnosis Date Noted  . Hypogonadism in male 01/02/2015  . IBS (irritable bowel syndrome) 08/04/2014  . Rectal bleeding 06/22/2014  . Change in bowel habits 06/22/2014  . Villous adenoma of rectum 06/21/2014  . GERD (gastroesophageal reflux disease) 04/25/2014  . Wart 03/08/2014  . Strain of hip flexor 05/04/2013  . Villous adenoma of rectosigmoid 12/09/2012  . DEPRESSION 05/07/2010  . Allergic rhinitis 05/07/2010  . OSTEOARTHRITIS 05/07/2010    Christopher Solis, PT 07/11/2015, 4:19 PM PHYSICAL THERAPY DISCHARGE SUMMARY  Visits from Start of Care: 2  Current functional level related to goals / functional outcomes: Pt attended 2 PT sessions and didn't return for further PT.     Remaining deficits: Unknown as pt didn't return.     Education / Equipment: HEP Plan: Patient agrees to discharge.  Patient goals were not met. Patient is being discharged due to not returning since the last visit.  ?????   [name], PT 09/26/2015 3:04 PM  [facility] [address] [place], [place], [address] Phone: [phone]   Fax:  [phone]  Name: Christopher Solis MRN: [id number] Date of Birth: Jul 24, 1960

## 2015-07-11 NOTE — Patient Instructions (Signed)
   Lifting Principles  Maintain proper posture and head alignment. Slide object as close as possible before lifting. Move obstacles out of the way. Test before lifting; ask for help if too heavy. Tighten stomach muscles without holding breath. Use smooth movements; do not jerk. Use legs to do the work, and pivot with feet. Distribute the work load symmetrically and close to the center of trunk. Push instead of pull whenever possible.   Squat down and hold basket close to stand. Use leg muscles to do the work.    Avoid twisting or bending back. Pivot around using foot movements, and bend at knees if needed when reaching for articles.        Getting Into / Out of Bed   Lower self to lie down on one side by raising legs and lowering head at the same time. Use arms to assist moving without twisting. Bend both knees to roll onto back if desired. To sit up, start from lying on side, and use same move-ments in reverse. Keep trunk aligned with legs.    Shift weight from front foot to back foot as item is lifted off shelf.    When leaning forward to pick object up from floor, extend one leg out behind. Keep back straight. Hold onto a sturdy support with other hand.      Sit upright, head facing forward. Try using a roll to support lower back. Keep shoulders relaxed, and avoid rounded back. Keep hips level with knees. Avoid crossing legs for long periods.     [facility] [address], [address] [place], [place] [address] Phone # [phone] Fax [phone]  

## 2015-07-17 ENCOUNTER — Ambulatory Visit: Payer: BLUE CROSS/BLUE SHIELD

## 2015-07-19 ENCOUNTER — Ambulatory Visit: Payer: BLUE CROSS/BLUE SHIELD | Admitting: Physical Therapy

## 2015-07-23 ENCOUNTER — Ambulatory Visit: Payer: BLUE CROSS/BLUE SHIELD | Admitting: Physical Therapy

## 2015-07-24 ENCOUNTER — Encounter: Payer: Self-pay | Admitting: Family Medicine

## 2015-07-24 ENCOUNTER — Ambulatory Visit (INDEPENDENT_AMBULATORY_CARE_PROVIDER_SITE_OTHER): Payer: BLUE CROSS/BLUE SHIELD | Admitting: Family Medicine

## 2015-07-24 ENCOUNTER — Telehealth: Payer: Self-pay | Admitting: Gastroenterology

## 2015-07-24 VITALS — BP 113/79 | HR 110 | Temp 98.6°F | Ht 68.75 in | Wt 194.0 lb

## 2015-07-24 DIAGNOSIS — R197 Diarrhea, unspecified: Secondary | ICD-10-CM | POA: Diagnosis not present

## 2015-07-24 MED ORDER — DICYCLOMINE HCL 20 MG PO TABS: 20.0000 mg | ORAL_TABLET | Freq: Three times a day (TID) | ORAL | Status: DC

## 2015-07-24 NOTE — Telephone Encounter (Signed)
Patient states he has had diarrhea off and on since procedure and it is getting worse. States he was taking Lomotil TID without relief. He saw Dr. Sarajane Jews today and was given Dicyclomine. He states he was up all night Sunday with diarrhea. He also reports rectal pain since his surgery. Scheduled with Lori Hvozdovic, PA-C on 07/26/15 at 2:45 PM.

## 2015-07-24 NOTE — Progress Notes (Signed)
Pre visit review using our clinic review tool, if applicable. No additional management support is needed unless otherwise documented below in the visit note. 

## 2015-07-24 NOTE — Progress Notes (Signed)
   Subjective:    Patient ID: Christopher Solis, male    DOB: [birth year], 55 y.o.   MRN: [id number]  HPI Here for diarrhea that has bothered him ever since he had a colonoscopy 4 weeks ago. The procedure involved removal of polyps, but no other pathology was noted in the bowel mucosa. No nausea, no fever. He has had mild cramps but not much pain. Using Lomotil with little relief.    Review of Systems  Constitutional: Negative.   Respiratory: Negative.   Cardiovascular: Negative.   Gastrointestinal: Positive for abdominal pain and diarrhea. Negative for nausea, vomiting, constipation, blood in stool, abdominal distention, anal bleeding and rectal pain.  Genitourinary: Negative.        Objective:   Physical Exam  Constitutional: He appears well-developed and well-nourished. No distress.  Neck: No thyromegaly present.  Cardiovascular: Normal rate, regular rhythm, normal heart sounds and intact distal pulses.   Pulmonary/Chest: Effort normal and breath sounds normal.  Abdominal: Soft. Bowel sounds are normal. He exhibits no distension. There is no rebound and no guarding.  Mildly tender in the RLQ   Lymphadenopathy:    He has no cervical adenopathy.          Assessment & Plan:  Diarrhea, possibly functional. Try Dicyclomine TID. Refer back to GI to evaluate.

## 2015-07-24 NOTE — Telephone Encounter (Signed)
Has he had any antibiotics or risk factors for C Diff? If so he should have a stool study to rule this out, however he has chronic, suspected to be functional, diarrhea since his surgery. He can increase lomotil as tolerated, or switch to immodium and take higher dose of this to see if it helps while he await his clinic visit. Okay to continue bentyl. Thanks

## 2015-07-25 ENCOUNTER — Ambulatory Visit: Payer: BLUE CROSS/BLUE SHIELD | Admitting: Physical Therapy

## 2015-07-25 NOTE — Telephone Encounter (Signed)
Patient states he has not taken any antibiotics. He will try Imodium to see if this helps until his OV.

## 2015-07-26 ENCOUNTER — Other Ambulatory Visit: Payer: Self-pay | Admitting: *Deleted

## 2015-07-26 ENCOUNTER — Ambulatory Visit (INDEPENDENT_AMBULATORY_CARE_PROVIDER_SITE_OTHER): Payer: BLUE CROSS/BLUE SHIELD | Admitting: Physician Assistant

## 2015-07-26 ENCOUNTER — Other Ambulatory Visit (INDEPENDENT_AMBULATORY_CARE_PROVIDER_SITE_OTHER): Payer: BLUE CROSS/BLUE SHIELD

## 2015-07-26 ENCOUNTER — Encounter: Payer: Self-pay | Admitting: Physician Assistant

## 2015-07-26 VITALS — BP 104/78 | HR 88 | Ht 68.75 in | Wt 195.5 lb

## 2015-07-26 DIAGNOSIS — K589 Irritable bowel syndrome without diarrhea: Secondary | ICD-10-CM | POA: Diagnosis not present

## 2015-07-26 DIAGNOSIS — K591 Functional diarrhea: Secondary | ICD-10-CM

## 2015-07-26 DIAGNOSIS — R7989 Other specified abnormal findings of blood chemistry: Secondary | ICD-10-CM

## 2015-07-26 LAB — CBC WITH DIFFERENTIAL/PLATELET
BASOS PCT: 0.5 % (ref 0.0–3.0)
Basophils Absolute: 0.1 10*3/uL (ref 0.0–0.1)
EOS ABS: 0.1 10*3/uL (ref 0.0–0.7)
EOS PCT: 1 % (ref 0.0–5.0)
HEMATOCRIT: 45.2 % (ref 39.0–52.0)
HEMOGLOBIN: 15.6 g/dL (ref 13.0–17.0)
LYMPHS PCT: 24.5 % (ref 12.0–46.0)
Lymphs Abs: 2.6 10*3/uL (ref 0.7–4.0)
MCHC: 34.5 g/dL (ref 30.0–36.0)
MCV: 92.4 fl (ref 78.0–100.0)
MONOS PCT: 11.9 % (ref 3.0–12.0)
Monocytes Absolute: 1.2 10*3/uL — ABNORMAL HIGH (ref 0.1–1.0)
NEUTROS ABS: 6.5 10*3/uL (ref 1.4–7.7)
Neutrophils Relative %: 62.1 % (ref 43.0–77.0)
Platelets: 449 10*3/uL — ABNORMAL HIGH (ref 150.0–400.0)
RBC: 4.89 Mil/uL (ref 4.22–5.81)
RDW: 12.8 % (ref 11.5–15.5)
WBC: 10.4 10*3/uL (ref 4.0–10.5)

## 2015-07-26 MED ORDER — METRONIDAZOLE 250 MG PO TABS: 250.0000 mg | ORAL_TABLET | Freq: Three times a day (TID) | ORAL | Status: DC

## 2015-07-26 NOTE — Progress Notes (Signed)
Patient ID: Christopher Solis, male   DOB: [birth year], 55 y.o.   MRN: [id number]     History of Present Illness: Christopher Solis  Is a 55 year old male who was known to Dr. Havery Moros. He has a history of multiple polyps, including a very large tubulovillous adenoma requiring surgery. Colonoscopy in June 2014 showed 6 polyps throughout the colon including a rectosigmoid mass that was circumferential and suspected to be malignant. Pathology of these areas showed sessile serrated adenomas, tubular adenomas, and hyperplastic polyps. 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 He was referred for surgery which was done in February 2015 at [facility] and he underwent a low anterior resection. Margins were clear and lymph nodes negative. He was evaluated by Dr. Havery Moros in December and subsequently had a repeat colonoscopy at which time 12 polyps were removed. He was advised to have surveillance in 3 years. He returns today with ongoing complaints of diarrhea. He states he will typically have one or 2 mushy bowel movements a day but then he will have days with 4-10 bowel movements with nocturnal stooling. He has had no bright red blood per rectum or melena. He often gets abdominal cramping prior to defecation, relieved with defecation. He notes that over the past several months his stools have been very oily and smell very strong. He denies use of alcohol. He was evaluated by his primary care physician and was prescribed dicyclomine. He is only used it for 1-1/2 days and has not noticed a difference yet he occasionally has some rectal burning if he has multiple bowel movements. He has had no fever, chills, or night sweats. He has no nausea or vomiting. His appetite as been good and his weight has been stable. Dietary review shows that the patient eats no breakfast. He typically will have fast food for lunch and supper he states he has suite potato french fries almost  every night with either fried chicken or fried fish. He has very little fiber in his diet.   Past Medical History  Diagnosis Date  . Allergy   . Depression   . Osteoarthritis   . Low testosterone   . Colon polyps     adenomatous  . Rectal mass     tubulovillous adenoma, s/p surgical resection    Past Surgical History  Procedure Laterality Date  . Vasectomy    . Elbow surgery Right     reconstructive / right elbow  . Tonsillectomy    . Wisdom tooth extraction    . Colon surgery      removal of large polyp   Family History  Problem Relation Age of Onset  . Arthritis Mother   . Coronary artery disease Mother   . Depression Mother   . Hyperlipidemia Brother   . Hypertension Mother   . Hypertension Brother   . Heart disease Brother   . Colon polyps Father   . Colon polyps Mother   . Colon cancer Other     maternal great grandmother  . Stomach cancer Paternal Grandmother    Social History  Substance Use Topics  . Smoking status: Current Every Day Smoker -- 37 years    Types: Cigarettes  . Smokeless tobacco: Former Systems developer    Quit date: 06/22/1980  . Alcohol Use: No   Current Outpatient Prescriptions  Medication Sig Dispense Refill  . cyclobenzaprine (FLEXERIL) 10 MG tablet Take 10 mg by mouth 3 (three) times daily as needed for muscle spasms.    Marland Kitchen  dicyclomine (BENTYL) 20 MG tablet Take 1 tablet (20 mg total) by mouth 3 (three) times daily before meals. 60 tablet 2  . loratadine (CLARITIN) 10 MG tablet Take 10 mg by mouth daily.    . montelukast (SINGULAIR) 10 MG tablet TAKE ONE TABLET BY MOUTH AT BEDTIME 30 tablet 11  . ondansetron (ZOFRAN) 8 MG tablet Take 1 tablet (8 mg total) by mouth every 6 (six) hours as needed for nausea or vomiting. 30 tablet 1  . Testosterone (ANDROGEL PUMP) 20.25 MG/ACT (1.62%) GEL Apply 4 application topically daily. 150 g 5  . VIAGRA 100 MG tablet TAKE ONE TABLET BY MOUTH ONCE DAILY AS NEEDED FOR  ERECTILE  DYSFUNCTION 10 tablet 11  .  metroNIDAZOLE (FLAGYL) 250 MG tablet Take 1 tablet (250 mg total) by mouth 3 (three) times daily. For 10 days. 30 tablet 0   No current facility-administered medications for this visit.   Allergies  Allergen Reactions  . Morphine Nausea And Vomiting  . Amoxicillin     nausea  . Propoxyphene N-Acetaminophen     Causes migraine, Darvocet N     Review of Systems: Gen: Denies any fever, chills, sweats, anorexia, fatigue, weakness, malaise, weight loss, and sleep disorder CV: Denies chest pain, angina, palpitations, syncope, orthopnea, PND, peripheral edema, and claudication. Resp: Denies dyspnea at rest, dyspnea with exercise, cough, sputum, wheezing, coughing up blood, and pleurisy. GI: Denies vomiting blood, jaundice, and fecal incontinence.   Denies dysphagia or odynophagia. GU : Denies urinary burning, blood in urine, urinary frequency, urinary hesitancy, nocturnal urination, and urinary incontinence. MS: Denies joint pain, limitation of movement, and swelling, stiffness, low back pain, extremity pain. Denies muscle weakness, cramps, atrophy.  Derm: Denies rash, itching, dry skin, hives, moles, warts, or unhealing ulcers.  Psych: Denies depression, anxiety, memory loss, suicidal ideation, hallucinations, paranoia, and confusion. Heme: Denies bruising, bleeding, and enlarged lymph nodes. Neuro:  Denies any headaches, dizziness, paresthesia Endo:  Denies any problems with DM, thyroid, adrenal     Physical Exam: BP 104/78 mmHg  Pulse 88  Ht 5' 8.75" (1.746 m)  Wt 195 lb 8 oz (88.678 kg)  BMI 29.09 kg/m2 General: Pleasant, well developed , male in no acute distress Head: Normocephalic and atraumatic Eyes:  sclerae anicteric, conjunctiva pink  Ears: Normal auditory acuity Lungs: Clear throughout to auscultation Heart: Regular rate and rhythm Abdomen: Soft, non distended, non-tender. No masses, no hepatomegaly. Normal bowel sounds Musculoskeletal: Symmetrical with no gross  deformities  Extremities: No edema  Neurological: Alert oriented x 4, grossly nonfocal Psychological:  Alert and cooperative. Normal mood and affect  Assessment and Recommendations:  55 year old male with a history of colon polyps and functional diarrhea presenting with a 1 month history of worsening diarrhea. His diarrhea is often associated with abdominal cramping which is relieved with defecation. Stool for C. Difficile will be obtained as well as a pancreatic fecal elastase. If his elastase was low, he will be given a trial of Creon. A CBC with differential will also be obtained. In the meantime, he's been instructed to adhere to a high-fiber low-fat diet. He will try Benefiber 1 heaping tablespoon in water twice daily. He will be given a trial of Flagyl 250 mg 1 by mouth 3 times daily for 10 days for possible small intestinal bacterial overgrowth. He will continue Bentyl as directed. He will follow up in 2 months, sooner if needed.        Stepheny Canal, Deloris Ping 07/26/2015,

## 2015-07-26 NOTE — Patient Instructions (Signed)
Your physician has requested that you go to the basement for lab work before leaving today.  We have sent the following medications to your pharmacy for you to pick up at your convenience: Flagyl 250 mg three times a day for 10 days.   Please purchase the following medications over the counter and take as directed: Benefiber 1 heaping tablespoon in 6 oz of water twice a day.   Continue Bentyl as directed.   Please keep your follow up Appointment with Dr. Havery Moros in 2 months.

## 2015-07-26 NOTE — Progress Notes (Signed)
Agree with assessment and plan. If C Diff negative can also titrate up lomotil as needed.

## 2015-08-01 ENCOUNTER — Encounter: Payer: BLUE CROSS/BLUE SHIELD | Admitting: Physical Therapy

## 2015-08-06 ENCOUNTER — Encounter: Payer: BLUE CROSS/BLUE SHIELD | Admitting: Physical Therapy

## 2015-08-08 ENCOUNTER — Other Ambulatory Visit: Payer: BLUE CROSS/BLUE SHIELD

## 2015-08-08 ENCOUNTER — Encounter: Payer: BLUE CROSS/BLUE SHIELD | Admitting: Physical Therapy

## 2015-08-08 DIAGNOSIS — K591 Functional diarrhea: Secondary | ICD-10-CM

## 2015-08-08 DIAGNOSIS — K589 Irritable bowel syndrome without diarrhea: Secondary | ICD-10-CM

## 2015-08-09 LAB — CLOSTRIDIUM DIFFICILE BY PCR: Toxigenic C. Difficile by PCR: DETECTED — CR

## 2015-08-13 ENCOUNTER — Other Ambulatory Visit: Payer: Self-pay | Admitting: *Deleted

## 2015-08-13 MED ORDER — VANCOMYCIN HCL 125 MG PO CAPS: ORAL_CAPSULE | ORAL | Status: DC

## 2015-08-13 MED ORDER — SACCHAROMYCES BOULARDII 250 MG PO CAPS: ORAL_CAPSULE | ORAL | Status: DC

## 2015-08-15 LAB — PANCREATIC ELASTASE, FECAL: Pancreatic Elastase-1, Stool: >500 mcg/g

## 2015-08-22 ENCOUNTER — Other Ambulatory Visit: Payer: Self-pay | Admitting: Family Medicine

## 2015-08-23 ENCOUNTER — Telehealth: Payer: Self-pay | Admitting: Physician Assistant

## 2015-08-23 NOTE — Telephone Encounter (Signed)
yes

## 2015-08-23 NOTE — Telephone Encounter (Signed)
Patient states his employer would not let him work for 10 days after his dx with c. Diff. He is asking for a note for work. Is this ok?

## 2015-08-24 NOTE — Telephone Encounter (Signed)
Note created and faxed.  Patient notified

## 2015-08-24 NOTE — Telephone Encounter (Signed)
Pt needs note faxed to [phone] Attn: Blair Heys

## 2015-09-24 ENCOUNTER — Ambulatory Visit: Payer: BLUE CROSS/BLUE SHIELD | Admitting: Gastroenterology

## 2015-11-19 ENCOUNTER — Encounter: Payer: Self-pay | Admitting: Adult Health

## 2015-11-19 ENCOUNTER — Ambulatory Visit (INDEPENDENT_AMBULATORY_CARE_PROVIDER_SITE_OTHER): Payer: BLUE CROSS/BLUE SHIELD | Admitting: Adult Health

## 2015-11-19 VITALS — BP 122/82 | Temp 98.1°F | Ht 68.75 in | Wt 206.2 lb

## 2015-11-19 DIAGNOSIS — J069 Acute upper respiratory infection, unspecified: Secondary | ICD-10-CM

## 2015-11-19 MED ORDER — DOXYCYCLINE HYCLATE 100 MG PO CAPS: 100.0000 mg | ORAL_CAPSULE | Freq: Two times a day (BID) | ORAL | Status: DC

## 2015-11-19 MED ORDER — DOXYCYCLINE HYCLATE 50 MG PO CAPS: 50.0000 mg | ORAL_CAPSULE | Freq: Two times a day (BID) | ORAL | Status: DC

## 2015-11-19 NOTE — Patient Instructions (Signed)
It was great meeting you today  I have sent in a prescription for Doxycycline 100mg , take twice a day for 10 days.   Follow up if you do not have any improvement

## 2015-11-19 NOTE — Progress Notes (Signed)
Subjective:    Patient ID: Christopher Solis, male    DOB: [birth year], 55 y.o.   MRN: [id number]  URI  This is a new problem. The current episode started in the past 7 days. The problem has been gradually worsening. There has been no fever. Associated symptoms include congestion (thick green sputum) and coughing. Pertinent negatives include no diarrhea, ear pain, headaches, rhinorrhea or sinus pain. He has tried nothing for the symptoms. The treatment provided mild relief.    Review of Systems  HENT: Positive for congestion (thick green sputum). Negative for ear pain and rhinorrhea.   Respiratory: Positive for cough.   Gastrointestinal: Negative for diarrhea.  Neurological: Negative for headaches.   Past Medical History  Diagnosis Date  . Allergy   . Depression   . Osteoarthritis   . Low testosterone   . Colon polyps     adenomatous  . Rectal mass     tubulovillous adenoma, s/p surgical resection    Social History   Social History  . Marital Status: Single    Spouse Name: N/A  . Number of Children: 1  . Years of Education: N/A   Occupational History  . maintenance Dealer    Social History Main Topics  . Smoking status: Current Every Day Smoker -- 37 years    Types: Cigarettes  . Smokeless tobacco: Former Systems developer    Quit date: 06/22/1980  . Alcohol Use: No  . Drug Use: No  . Sexual Activity: Not on file   Other Topics Concern  . Not on file   Social History Narrative    Past Surgical History  Procedure Laterality Date  . Vasectomy    . Elbow surgery Right     reconstructive / right elbow  . Tonsillectomy    . Wisdom tooth extraction    . Colon surgery      removal of large polyp    Family History  Problem Relation Age of Onset  . Arthritis Mother   . Coronary artery disease Mother   . Depression Mother   . Hyperlipidemia Brother   . Hypertension Mother   . Hypertension Brother   . Heart disease Brother   . Colon polyps Father   . Colon polyps  Mother   . Colon cancer Other     maternal great grandmother  . Stomach cancer Paternal Grandmother     Allergies  Allergen Reactions  . Morphine Nausea And Vomiting  . Amoxicillin     nausea  . Propoxyphene N-Acetaminophen     Causes migraine, Darvocet N    Current Outpatient Prescriptions on File Prior to Visit  Medication Sig Dispense Refill  . cyclobenzaprine (FLEXERIL) 10 MG tablet TAKE ONE TABLET BY MOUTH THREE TIMES DAILY AS NEEDED FOR MUSCLE SPASM 60 tablet 3  . dicyclomine (BENTYL) 20 MG tablet Take 1 tablet (20 mg total) by mouth 3 (three) times daily before meals. 60 tablet 2  . loratadine (CLARITIN) 10 MG tablet Take 10 mg by mouth daily.    . montelukast (SINGULAIR) 10 MG tablet TAKE ONE TABLET BY MOUTH AT BEDTIME 30 tablet 11  . Testosterone (ANDROGEL PUMP) 20.25 MG/ACT (1.62%) GEL Apply 4 application topically daily. 150 g 5  . VIAGRA 100 MG tablet TAKE ONE TABLET BY MOUTH ONCE DAILY AS NEEDED FOR  ERECTILE  DYSFUNCTION 10 tablet 11   No current facility-administered medications on file prior to visit.    BP 122/82 mmHg  Temp(Src) 98.1 F (36.7 C) (Oral)  Ht 5' 8.75" (1.746 m)  Wt 206 lb 3.2 oz (93.532 kg)  BMI 30.68 kg/m2       Objective:   Physical Exam  Constitutional: He is oriented to person, place, and time. He appears well-developed and well-nourished. No distress.  Cardiovascular: Normal rate, regular rhythm, normal heart sounds and intact distal pulses.  Exam reveals no gallop and no friction rub.   No murmur heard. Pulmonary/Chest: Effort normal and breath sounds normal. No respiratory distress. He has no wheezes. He has no rales. He exhibits no tenderness.  Neurological: He is alert and oriented to person, place, and time.  Skin: Skin is warm and dry. No rash noted. He is not diaphoretic. No erythema. No pallor.  Psychiatric: He has a normal mood and affect. His behavior is normal. Thought content normal.  Vitals reviewed.     Assessment &  Plan:  1. Acute upper respiratory infection - doxycycline (VIBRAMYCIN) 100 MG capsule; Take 1 capsule (100 mg total) by mouth 2 (two) times daily.  Dispense: 20 capsule; Refill: 0 - Mucinex  - Stay hydrated and rest - Tylenol/Motrin for symptom relief.  - Follow up in 2-3 days if no improvement   Dorothyann Peng, NP

## 2015-12-11 ENCOUNTER — Other Ambulatory Visit: Payer: Self-pay | Admitting: Family Medicine

## 2015-12-14 ENCOUNTER — Other Ambulatory Visit: Payer: Self-pay | Admitting: Family Medicine

## 2016-01-10 ENCOUNTER — Encounter: Payer: Self-pay | Admitting: Family Medicine

## 2016-01-10 ENCOUNTER — Ambulatory Visit (INDEPENDENT_AMBULATORY_CARE_PROVIDER_SITE_OTHER): Payer: BLUE CROSS/BLUE SHIELD | Admitting: Family Medicine

## 2016-01-10 VITALS — BP 132/73 | HR 99 | Temp 98.3°F | Ht 68.75 in | Wt 207.0 lb

## 2016-01-10 DIAGNOSIS — R059 Cough, unspecified: Secondary | ICD-10-CM

## 2016-01-10 DIAGNOSIS — Z0001 Encounter for general adult medical examination with abnormal findings: Secondary | ICD-10-CM | POA: Diagnosis not present

## 2016-01-10 DIAGNOSIS — R05 Cough: Secondary | ICD-10-CM

## 2016-01-10 DIAGNOSIS — Z0000 Encounter for general adult medical examination without abnormal findings: Secondary | ICD-10-CM

## 2016-01-10 NOTE — Progress Notes (Signed)
Pre visit review using our clinic review tool, if applicable. No additional management support is needed unless otherwise documented below in the visit note. 

## 2016-01-10 NOTE — Progress Notes (Signed)
   Subjective:    Patient ID: Christopher Solis, male    DOB: [birth year], 55 y.o.   MRN: [id number]  HPI 55 yr old male for a well exam. He feels well except he mentions a cough that started a month ago. It produced clear sputum. No chest pain or SOB or fever. He does not feel sick. He has been a heavy smoker for many years, and he is currently smoking 1 ppd.    Review of Systems  Constitutional: Negative.   HENT: Negative.   Eyes: Negative.   Respiratory: Positive for cough. Negative for apnea, choking, chest tightness, shortness of breath, wheezing and stridor.   Cardiovascular: Negative.   Gastrointestinal: Negative.   Genitourinary: Negative.   Musculoskeletal: Negative.   Skin: Negative.   Neurological: Negative.   Psychiatric/Behavioral: Negative.        Objective:   Physical Exam  Constitutional: He is oriented to person, place, and time. He appears well-developed and well-nourished. No distress.  HENT:  Head: Normocephalic and atraumatic.  Right Ear: External ear normal.  Left Ear: External ear normal.  Nose: Nose normal.  Mouth/Throat: Oropharynx is clear and moist. No oropharyngeal exudate.  Eyes: Conjunctivae and EOM are normal. Pupils are equal, round, and reactive to light. Right eye exhibits no discharge. Left eye exhibits no discharge. No scleral icterus.  Neck: Neck supple. No JVD present. No tracheal deviation present. No thyromegaly present.  Cardiovascular: Normal rate, regular rhythm, normal heart sounds and intact distal pulses.  Exam reveals no gallop and no friction rub.   No murmur heard. Pulmonary/Chest: Effort normal and breath sounds normal. No respiratory distress. He has no wheezes. He has no rales. He exhibits no tenderness.  Abdominal: Soft. Bowel sounds are normal. He exhibits no distension and no mass. There is no tenderness. There is no rebound and no guarding.  Genitourinary: Rectum normal, prostate normal and penis normal. Rectal exam shows guaiac  negative stool. No penile tenderness.  Musculoskeletal: Normal range of motion. He exhibits no edema or tenderness.  Lymphadenopathy:    He has no cervical adenopathy.  Neurological: He is alert and oriented to person, place, and time. He has normal reflexes. No cranial nerve deficit. He exhibits normal muscle tone. Coordination normal.  Skin: Skin is warm and dry. No rash noted. He is not diaphoretic. No erythema. No pallor.  Psychiatric: He has a normal mood and affect. His behavior is normal. Judgment and thought content normal.          Assessment & Plan:  Well exam. We discussed diet and exercise. It sounds like he has developed some chronic bronchitis so I again strongly advised him to quit smoking. We will set up a CXR soon. He will get fasting labs sometime soon as well.  Laurey Morale, MD

## 2016-01-11 ENCOUNTER — Other Ambulatory Visit (INDEPENDENT_AMBULATORY_CARE_PROVIDER_SITE_OTHER): Payer: BLUE CROSS/BLUE SHIELD

## 2016-01-11 ENCOUNTER — Ambulatory Visit (INDEPENDENT_AMBULATORY_CARE_PROVIDER_SITE_OTHER)
Admission: RE | Admit: 2016-01-11 | Discharge: 2016-01-11 | Disposition: A | Discharge status: Home / Self Care | Payer: BLUE CROSS/BLUE SHIELD | Source: Ambulatory Visit | Attending: Family Medicine | Admitting: Family Medicine

## 2016-01-11 DIAGNOSIS — R05 Cough: Secondary | ICD-10-CM

## 2016-01-11 DIAGNOSIS — R7989 Other specified abnormal findings of blood chemistry: Secondary | ICD-10-CM | POA: Diagnosis not present

## 2016-01-11 DIAGNOSIS — R059 Cough, unspecified: Secondary | ICD-10-CM

## 2016-01-11 DIAGNOSIS — Z0000 Encounter for general adult medical examination without abnormal findings: Secondary | ICD-10-CM

## 2016-01-11 LAB — POC URINALSYSI DIPSTICK (AUTOMATED)
BILIRUBIN UA: NEGATIVE
Glucose, UA: NEGATIVE
KETONES UA: NEGATIVE
Leukocytes, UA: NEGATIVE
Nitrite, UA: NEGATIVE
PH UA: 5.5
SPEC GRAV UA: 1.025
Urobilinogen, UA: 1

## 2016-01-11 LAB — CBC WITH DIFFERENTIAL/PLATELET
BASOS ABS: 0 10*3/uL (ref 0.0–0.1)
Basophils Relative: 0.4 % (ref 0.0–3.0)
EOS ABS: 0.1 10*3/uL (ref 0.0–0.7)
Eosinophils Relative: 1.7 % (ref 0.0–5.0)
HCT: 45.9 % (ref 39.0–52.0)
Hemoglobin: 15.8 g/dL (ref 13.0–17.0)
LYMPHS ABS: 2.5 10*3/uL (ref 0.7–4.0)
Lymphocytes Relative: 28.7 % (ref 12.0–46.0)
MCHC: 34.4 g/dL (ref 30.0–36.0)
MCV: 92.8 fl (ref 78.0–100.0)
MONO ABS: 0.9 10*3/uL (ref 0.1–1.0)
Monocytes Relative: 10.8 % (ref 3.0–12.0)
NEUTROS ABS: 5.1 10*3/uL (ref 1.4–7.7)
NEUTROS PCT: 58.4 % (ref 43.0–77.0)
PLATELETS: 414 10*3/uL — AB (ref 150.0–400.0)
RBC: 4.95 Mil/uL (ref 4.22–5.81)
RDW: 12.7 % (ref 11.5–15.5)
WBC: 8.7 10*3/uL (ref 4.0–10.5)

## 2016-01-11 LAB — HEPATIC FUNCTION PANEL
ALBUMIN: 4.3 g/dL (ref 3.5–5.2)
ALT: 18 U/L (ref 0–53)
AST: 18 U/L (ref 0–37)
Alkaline Phosphatase: 70 U/L (ref 39–117)
BILIRUBIN TOTAL: 0.4 mg/dL (ref 0.2–1.2)
Bilirubin, Direct: 0.1 mg/dL (ref 0.0–0.3)
Total Protein: 6.8 g/dL (ref 6.0–8.3)

## 2016-01-11 LAB — LIPID PANEL
CHOL/HDL RATIO: 7
CHOLESTEROL: 183 mg/dL (ref 0–200)
HDL: 28 mg/dL — AB (ref >39.00)
NONHDL: 154.95
TRIGLYCERIDES: 327 mg/dL — AB (ref 0.0–149.0)
VLDL: 65.4 mg/dL — ABNORMAL HIGH (ref 0.0–40.0)

## 2016-01-11 LAB — BASIC METABOLIC PANEL
BUN: 17 mg/dL (ref 6–23)
CALCIUM: 9.6 mg/dL (ref 8.4–10.5)
CHLORIDE: 102 meq/L (ref 96–112)
CO2: 30 meq/L (ref 19–32)
CREATININE: 1.1 mg/dL (ref 0.40–1.50)
GFR: 73.56 mL/min (ref >60.00)
Glucose, Bld: 95 mg/dL (ref 70–99)
Potassium: 4.5 mEq/L (ref 3.5–5.1)
Sodium: 139 mEq/L (ref 135–145)

## 2016-01-11 LAB — PSA: PSA: 2.07 ng/mL (ref 0.10–4.00)

## 2016-01-11 LAB — TSH: TSH: 1.32 u[IU]/mL (ref 0.35–4.50)

## 2016-01-11 LAB — LDL CHOLESTEROL, DIRECT: LDL DIRECT: 100 mg/dL

## 2016-02-25 ENCOUNTER — Other Ambulatory Visit: Payer: Self-pay | Admitting: Family Medicine

## 2016-04-19 ENCOUNTER — Other Ambulatory Visit: Payer: Self-pay | Admitting: Family Medicine

## 2016-04-21 NOTE — Telephone Encounter (Signed)
Looks like pt is due for a testosterone level?

## 2016-04-23 NOTE — Telephone Encounter (Signed)
Refill for 6 months. 

## 2016-05-15 ENCOUNTER — Other Ambulatory Visit: Payer: Self-pay | Admitting: Family Medicine

## 2016-05-20 ENCOUNTER — Encounter: Payer: Self-pay | Admitting: Family Medicine

## 2016-05-20 ENCOUNTER — Ambulatory Visit (INDEPENDENT_AMBULATORY_CARE_PROVIDER_SITE_OTHER): Payer: BLUE CROSS/BLUE SHIELD | Admitting: Family Medicine

## 2016-05-20 VITALS — BP 140/80 | HR 98 | Temp 98.5°F | Ht 68.75 in | Wt 208.0 lb

## 2016-05-20 DIAGNOSIS — Z23 Encounter for immunization: Secondary | ICD-10-CM | POA: Diagnosis not present

## 2016-05-20 DIAGNOSIS — B9789 Other viral agents as the cause of diseases classified elsewhere: Secondary | ICD-10-CM

## 2016-05-20 DIAGNOSIS — J069 Acute upper respiratory infection, unspecified: Secondary | ICD-10-CM | POA: Diagnosis not present

## 2016-05-20 NOTE — Progress Notes (Signed)
   Subjective:    Patient ID: Christopher Solis, male    DOB: [birth year], 55 y.o.   MRN: [id number]  HPI Here to check his lungs and to ask if he can get a flu shot. He has been coughing a bit for a few days but he feels well and he does not have a fever.    Review of Systems  Constitutional: Negative.   HENT: Negative.   Eyes: Negative.   Respiratory: Positive for cough. Negative for chest tightness, shortness of breath and wheezing.   Cardiovascular: Negative.        Objective:   Physical Exam  Constitutional: He appears well-developed and well-nourished.  HENT:  Right Ear: External ear normal.  Left Ear: External ear normal.  Nose: Nose normal.  Mouth/Throat: Oropharynx is clear and moist.  Eyes: Conjunctivae are normal.  Neck: No thyromegaly present.  Pulmonary/Chest: Effort normal and breath sounds normal.  Lymphadenopathy:    He has no cervical adenopathy.          Assessment & Plan:  Viral URI. He seems to be getting over it. He is given a flu shot today.  Laurey Morale, MD

## 2016-05-20 NOTE — Progress Notes (Signed)
Pre visit review using our clinic review tool, if applicable. No additional management support is needed unless otherwise documented below in the visit note. 

## 2016-05-20 NOTE — Addendum Note (Signed)
Addended by: Aggie Hacker A on: 05/20/2016 11:52 AM   Modules accepted: Orders

## 2016-06-10 ENCOUNTER — Other Ambulatory Visit: Payer: Self-pay | Admitting: Family Medicine

## 2016-06-17 ENCOUNTER — Ambulatory Visit (INDEPENDENT_AMBULATORY_CARE_PROVIDER_SITE_OTHER): Payer: BLUE CROSS/BLUE SHIELD | Admitting: Family Medicine

## 2016-06-17 ENCOUNTER — Encounter: Payer: Self-pay | Admitting: Family Medicine

## 2016-06-17 VITALS — BP 132/86 | HR 102 | Temp 98.0°F | Wt 209.2 lb

## 2016-06-17 DIAGNOSIS — G8929 Other chronic pain: Secondary | ICD-10-CM | POA: Diagnosis not present

## 2016-06-17 DIAGNOSIS — M25561 Pain in right knee: Secondary | ICD-10-CM | POA: Diagnosis not present

## 2016-06-17 NOTE — Progress Notes (Signed)
   Subjective:    Patient ID: Christopher Solis, male    DOB: February 26, 1961, 56 y.o.   MRN: [id number]  HPI Here complaining of right knee pain. He has had intermittent pain in this knee for years but about 4 weeks ago while he was leaning up against a wall he felt a "pop", and he has had more intense pains since then. No locking or goving way. No pain on standing or walking on level ground, but he has a lot of pain on going up or down steps or with squatting. Using Ibuprofen.    Review of Systems  Constitutional: Negative.   Musculoskeletal: Positive for arthralgias and gait problem. Negative for back pain and joint swelling.       Objective:   Physical Exam  Constitutional: He appears well-developed and well-nourished.  Musculoskeletal:  The right knee appears normal with no erythema or swelling. No tenderness. Full ROM. No crepitus.           Assessment & Plan:  Right knee pain, possible meniscal tear. Refer to Orthopedics.  Alysia Penna, MD

## 2016-06-17 NOTE — Progress Notes (Signed)
Pre visit review using our clinic review tool, if applicable. No additional management support is needed unless otherwise documented below in the visit note. 

## 2016-07-31 ENCOUNTER — Other Ambulatory Visit: Payer: Self-pay | Admitting: Family Medicine

## 2016-09-20 ENCOUNTER — Other Ambulatory Visit: Payer: Self-pay | Admitting: Family Medicine

## 2016-11-19 ENCOUNTER — Other Ambulatory Visit: Payer: Self-pay | Admitting: Family Medicine

## 2017-02-23 ENCOUNTER — Encounter: Payer: Self-pay | Admitting: Family Medicine

## 2017-02-23 ENCOUNTER — Ambulatory Visit (INDEPENDENT_AMBULATORY_CARE_PROVIDER_SITE_OTHER): Payer: 59 | Admitting: Family Medicine

## 2017-02-23 VITALS — BP 139/89 | HR 101 | Temp 98.7°F | Ht 68.75 in | Wt 213.0 lb

## 2017-02-23 DIAGNOSIS — J018 Other acute sinusitis: Secondary | ICD-10-CM

## 2017-02-23 MED ORDER — METHYLPREDNISOLONE ACETATE 80 MG/ML IJ SUSP
120.0000 mg | Freq: Once | INTRAMUSCULAR | Status: AC
  Administered 2017-02-23: 120 mg via INTRAMUSCULAR

## 2017-02-23 MED ORDER — OMEPRAZOLE 40 MG PO CPDR: 40.0000 mg | DELAYED_RELEASE_CAPSULE | Freq: Every day | ORAL | 3 refills | Status: DC

## 2017-02-23 MED ORDER — AZITHROMYCIN 250 MG PO TABS: ORAL_TABLET | ORAL | 0 refills | Status: DC

## 2017-02-23 NOTE — Addendum Note (Signed)
Addended by: Aggie Hacker A on: 02/23/2017 10:32 AM   Modules accepted: Orders

## 2017-02-23 NOTE — Patient Instructions (Signed)
WE NOW OFFER   [facility] FAST TRACK!!!  SAME DAY Appointments for ACUTE CARE  Such as: Sprains, Injuries, cuts, abrasions, rashes, muscle pain, joint pain, back pain Colds, flu, sore throats, headache, allergies, cough, fever  Ear pain, sinus and eye infections Abdominal pain, nausea, vomiting, diarrhea, upset stomach Animal/insect bites  3 Easy Ways to Schedule: Walk-In Scheduling Call in scheduling Mychart Sign-up: [email or web address]         

## 2017-02-23 NOTE — Progress Notes (Signed)
   Subjective:    Patient ID: Rodolphe Edmonston, male    DOB: 04/10/1961, 56 y.o.   MRN: [id number]  HPI Here for 5 days of sinus pressure, headache, PND, and a dry cough.    Review of Systems  Constitutional: Negative.   HENT: Positive for congestion, postnasal drip, sinus pain and sinus pressure. Negative for sore throat.   Eyes: Negative.   Respiratory: Positive for cough.        Objective:   Physical Exam  Constitutional: He appears well-developed and well-nourished.  HENT:  Right Ear: External ear normal.  Left Ear: External ear normal.  Nose: Nose normal.  Mouth/Throat: Oropharynx is clear and moist.  Eyes: Conjunctivae are normal.  Neck: No thyromegaly present.  Pulmonary/Chest: Effort normal and breath sounds normal. No respiratory distress. He has no wheezes. He has no rales.  Lymphadenopathy:    He has no cervical adenopathy.          Assessment & Plan:  Sinusitis, treat with a Zpack and a steroid shot. Written out of work from 02-19-17 through today.  Alysia Penna, MD

## 2017-04-03 ENCOUNTER — Other Ambulatory Visit: Payer: Self-pay | Admitting: Family Medicine

## 2017-05-05 ENCOUNTER — Ambulatory Visit: Payer: 59 | Admitting: Family Medicine

## 2017-05-05 ENCOUNTER — Encounter: Payer: Self-pay | Admitting: Family Medicine

## 2017-05-05 VITALS — BP 150/100 | HR 98 | Temp 98.3°F | Wt 209.4 lb

## 2017-05-05 DIAGNOSIS — J0141 Acute recurrent pansinusitis: Secondary | ICD-10-CM

## 2017-05-05 MED ORDER — LEVOFLOXACIN 500 MG PO TABS: 500.0000 mg | ORAL_TABLET | Freq: Every day | ORAL | 0 refills | Status: AC

## 2017-05-05 NOTE — Progress Notes (Signed)
   Subjective:    Patient ID: Christopher Solis, male    DOB: [birth year], 56 y.o.   MRN: [id number]  HPI Here for a recurrence of sinus pressure, PND, and coughing up green sputum. No fever. He was seen 2 months ago for similar symptoms and was given a Zpack. This improved things but not all the way.    Review of Systems  Constitutional: Negative.   HENT: Positive for congestion, postnasal drip, sinus pressure, sinus pain and sore throat.   Eyes: Negative.   Respiratory: Positive for cough and chest tightness. Negative for wheezing.   Cardiovascular: Negative.        Objective:   Physical Exam  Constitutional: He appears well-developed and well-nourished.  HENT:  Right Ear: External ear normal.  Left Ear: External ear normal.  Nose: Nose normal.  Mouth/Throat: Oropharynx is clear and moist.  Eyes: Conjunctivae are normal.  Neck: No thyromegaly present.  Pulmonary/Chest: Effort normal and breath sounds normal. No respiratory distress. He has no wheezes. He has no rales.  Lymphadenopathy:    He has no cervical adenopathy.          Assessment & Plan:  Recurrent sinusitis, treat with Levaquin. Alysia Penna, MD

## 2017-05-22 ENCOUNTER — Other Ambulatory Visit: Payer: Self-pay | Admitting: Family Medicine

## 2017-05-22 NOTE — Telephone Encounter (Signed)
Sent to PCP for approval.  

## 2017-06-23 ENCOUNTER — Telehealth: Payer: Self-pay | Admitting: Family Medicine

## 2017-06-23 ENCOUNTER — Encounter: Payer: Self-pay | Admitting: Family Medicine

## 2017-06-23 ENCOUNTER — Ambulatory Visit: Payer: 59 | Admitting: Family Medicine

## 2017-06-23 VITALS — BP 120/80 | HR 90 | Temp 97.8°F | Ht 68.75 in | Wt 209.5 lb

## 2017-06-23 DIAGNOSIS — J988 Other specified respiratory disorders: Secondary | ICD-10-CM

## 2017-06-23 DIAGNOSIS — Z720 Tobacco use: Secondary | ICD-10-CM | POA: Diagnosis not present

## 2017-06-23 DIAGNOSIS — R05 Cough: Secondary | ICD-10-CM

## 2017-06-23 DIAGNOSIS — R059 Cough, unspecified: Secondary | ICD-10-CM

## 2017-06-23 MED ORDER — PREDNISONE 20 MG PO TABS: 40.0000 mg | ORAL_TABLET | Freq: Every day | ORAL | 0 refills | Status: DC

## 2017-06-23 MED ORDER — AZITHROMYCIN 250 MG PO TABS: ORAL_TABLET | ORAL | 0 refills | Status: DC

## 2017-06-23 NOTE — Patient Instructions (Addendum)
BEFORE YOU LEAVE: -follow up: 1-2 weeks with PCP  I sent the antibiotic and the steroid to the pharmacy.  Please, please quit smoking  Follow up sooner if worsening or not improving with treatment.

## 2017-06-23 NOTE — Telephone Encounter (Signed)
Sent to PCP ?

## 2017-06-23 NOTE — Progress Notes (Signed)
HPI:  Acute visit for cough -started:chronic per his report - chronic cough, congestion, pnd -worse x3 weeks w/ reports thick mucus is green at times and mild wheeze at times all times 3 weeks  -denies:fever, sinus pain, hemoptysis, malaise, wt loss -hx allergies, gerd and tobacco use -sick contacts/travel/risks: no reported flu, strep or tick exposure  ROS: See pertinent positives and negatives per HPI.  Past Medical History:  Diagnosis Date  . Allergy   . Colon polyps    adenomatous  . Depression   . Low testosterone   . Osteoarthritis   . Rectal mass    tubulovillous adenoma, s/p surgical resection    Past Surgical History:  Procedure Laterality Date  . COLON SURGERY  07/20/2013   removal of tubulovillous polyp in rectosigmoid colon at [facility]   . COLONOSCOPY  06/14/2015   per Dr. Havery Moros, adenomatous polyps, repeat in 3 years   . ELBOW SURGERY Right    reconstructive / right elbow  . TONSILLECTOMY    . VASECTOMY    . WISDOM TOOTH EXTRACTION      Family History  Problem Relation Age of Onset  . Arthritis Mother   . Coronary artery disease Mother   . Depression Mother   . Hypertension Mother   . Colon polyps Mother   . Colon polyps Father   . Hypertension Brother   . Heart disease Brother   . Hyperlipidemia Brother   . Colon cancer Other        maternal great grandmother  . Stomach cancer Paternal Grandmother     Social History   Socioeconomic History  . Marital status: Single    Spouse name: None  . Number of children: 1  . Years of education: None  . Highest education level: None  Social Needs  . Financial resource strain: None  . Food insecurity - worry: None  . Food insecurity - inability: None  . Transportation needs - medical: None  . Transportation needs - non-medical: None  Occupational History  . Occupation: Therapist, occupational  Tobacco Use  . Smoking status: Current Every Day Smoker    Years: 37.00    Types: Cigarettes  .  Smokeless tobacco: Former Systems developer    Quit date: 06/22/1980  Substance and Sexual Activity  . Alcohol use: No    Alcohol/week: 0.0 oz  . Drug use: No  . Sexual activity: None  Other Topics Concern  . None  Social History Narrative  . None     Current Outpatient Medications:  .  cyclobenzaprine (FLEXERIL) 10 MG tablet, TAKE 1 TABLET BY MOUTH THREE TIMES DAILY AS NEEDED FOR MUSCLE SPASM, Disp: 60 tablet, Rfl: 1 .  loratadine (CLARITIN) 10 MG tablet, Take 10 mg by mouth daily., Disp: , Rfl:  .  montelukast (SINGULAIR) 10 MG tablet, TAKE ONE TABLET BY MOUTH ONCE DAILY AT BEDTIME, Disp: 30 tablet, Rfl: 6 .  omeprazole (PRILOSEC) 40 MG capsule, Take 1 capsule (40 mg total) by mouth daily., Disp: 90 capsule, Rfl: 3 .  ondansetron (ZOFRAN) 8 MG tablet, TAKE ONE TABLET BY MOUTH EVERY 6 HOURS AS NEEDED FOR NAUSEA AND VOMITING, Disp: 30 tablet, Rfl: 2 .  VIAGRA 100 MG tablet, TAKE 1 TAB BY MOUTH ONCE A DAY AS NEEDED FOR ERECTILE DYSFUNCTION, Disp: 10 tablet, Rfl: 0 .  azithromycin (ZITHROMAX) 250 MG tablet, 2 tabs day 1, then one tab daily, Disp: 6 tablet, Rfl: 0 .  predniSONE (DELTASONE) 20 MG tablet, Take 2 tablets (40  mg total) by mouth daily with breakfast., Disp: 8 tablet, Rfl: 0  EXAM:  Vitals:   06/23/17 1437  BP: 120/80  Pulse: 90  Temp: 97.8 F (36.6 C)    Body mass index is 31.16 kg/m.  GENERAL: vitals reviewed and listed above, alert, oriented, appears well hydrated and in no acute distress  HEENT: atraumatic, conjunttiva clear, no obvious abnormalities on inspection of external nose and ears, normal appearance of ear canals and TMs, clear nasal congestion, mild post oropharyngeal erythema with PND, no tonsillar edema or exudate, no sinus TTP  NECK: no obvious masses on inspection  LUNGS: no resp distress, scattered exp wheeze  CV: HRRR, no peripheral edema  MS: moves all extremities without noticeable abnormality  PSYCH: pleasant and cooperative, no obvious depression or  anxiety  ASSESSMENT AND PLAN:  Discussed the following assessment and plan:  Cough  Tobacco use  Respiratory infection  -We discussed potential etiologies, with multiple potential triggers for chronic cough and likely bronchitis. Advised CXR, smoking cessation, prednisone and abx. We discussed treatment side effects, likely course, antibiotic misuse, transmission, and signs of developing a serious illness. Offered help to quit smoking - counseled at least 3 minutes. -he declined cxr and smoking cessation but agreed to other recs -advised f/u in 2 weeks with PCP and if not improving advised he should def get CXR, he agreed -discussed possibility and likely COPD, possible other complications smoking - f/u with PCP -of course, we advised to return or notify a doctor immediately if symptoms worsen or persist or new concerns arise.

## 2017-06-23 NOTE — Telephone Encounter (Signed)
Pt in the office today wanted a work note for being out of work since Thursday 06/18/17 and he saw Dr. Maudie Mercury and she would not give him one but would like to see if he could get one from Dr. Sarajane Jews. Pt is aware that Dr. Sarajane Jews is not in the office today but he will respond to the msg on tomorrow.

## 2017-06-24 NOTE — Telephone Encounter (Signed)
Please write him a work note

## 2017-06-24 NOTE — Telephone Encounter (Signed)
Pt needs note for work stating being out Friday 06/19/17 through 06/24/17

## 2017-06-24 NOTE — Telephone Encounter (Signed)
Pt advised and voiced understanding. Placed up front for pick up.

## 2017-07-07 ENCOUNTER — Ambulatory Visit: Payer: 59 | Admitting: Family Medicine

## 2017-07-17 ENCOUNTER — Other Ambulatory Visit: Payer: Self-pay | Admitting: Family Medicine

## 2017-07-17 NOTE — Telephone Encounter (Signed)
Last OV 05/05/2017  Last refilled 05/22/2017 disp 60 with 1 refill   Sent to PCP for approval

## 2017-10-17 ENCOUNTER — Other Ambulatory Visit: Payer: Self-pay | Admitting: Family Medicine

## 2017-10-19 ENCOUNTER — Encounter: Payer: Self-pay | Admitting: Family Medicine

## 2017-10-19 ENCOUNTER — Ambulatory Visit: Payer: 59 | Admitting: Family Medicine

## 2017-10-19 ENCOUNTER — Other Ambulatory Visit: Payer: Self-pay

## 2017-10-19 VITALS — BP 130/78 | HR 99 | Temp 98.1°F | Wt 210.2 lb

## 2017-10-19 DIAGNOSIS — J209 Acute bronchitis, unspecified: Secondary | ICD-10-CM

## 2017-10-19 MED ORDER — DOXYCYCLINE HYCLATE 100 MG PO CAPS: 100.0000 mg | ORAL_CAPSULE | Freq: Two times a day (BID) | ORAL | 0 refills | Status: DC

## 2017-10-19 MED ORDER — HYDROCODONE-HOMATROPINE 5-1.5 MG/5ML PO SYRP: 5.0000 mL | ORAL_SOLUTION | ORAL | 0 refills | Status: DC | PRN

## 2017-10-19 NOTE — Progress Notes (Signed)
   Subjective:    Patient ID: Christopher Solis, male    DOB: 1960-06-21, 57 y.o.   MRN: [id number]  HPI Here for 3 days of PND, ST, and coughing up yellow sputum. No fever.    Review of Systems  Constitutional: Negative.   HENT: Positive for congestion, postnasal drip and sore throat. Negative for sinus pressure and sinus pain.   Eyes: Negative.   Respiratory: Positive for cough.        Objective:   Physical Exam  Constitutional: He appears well-developed and well-nourished.  HENT:  Right Ear: External ear normal.  Left Ear: External ear normal.  Nose: Nose normal.  Mouth/Throat: Oropharynx is clear and moist.  Eyes: Conjunctivae are normal.  Pulmonary/Chest: Effort normal. No stridor. He has wheezes. He has no rales.  Lymphadenopathy:    He has no cervical adenopathy.          Assessment & Plan:  Bronchitis. Treat with Doxycycline.  Alysia Penna, MD

## 2017-10-23 ENCOUNTER — Encounter: Payer: Self-pay | Admitting: Family Medicine

## 2017-10-23 ENCOUNTER — Ambulatory Visit: Payer: 59 | Admitting: Family Medicine

## 2017-10-23 VITALS — BP 110/78 | HR 112 | Temp 98.4°F | Wt 210.0 lb

## 2017-10-23 DIAGNOSIS — J209 Acute bronchitis, unspecified: Secondary | ICD-10-CM | POA: Diagnosis not present

## 2017-10-23 MED ORDER — LEVOFLOXACIN 500 MG PO TABS: 500.0000 mg | ORAL_TABLET | Freq: Every day | ORAL | 0 refills | Status: AC

## 2017-10-23 MED ORDER — HYDROCODONE-HOMATROPINE 5-1.5 MG/5ML PO SYRP: 5.0000 mL | ORAL_SOLUTION | ORAL | 0 refills | Status: DC | PRN

## 2017-10-23 MED ORDER — ALBUTEROL SULFATE HFA 108 (90 BASE) MCG/ACT IN AERS
2.0000 | INHALATION_SPRAY | RESPIRATORY_TRACT | 2 refills | Status: DC | PRN
Start: 2017-10-23 — End: 2018-04-12

## 2017-10-23 MED ORDER — METHYLPREDNISOLONE 4 MG PO TBPK: ORAL_TABLET | ORAL | 0 refills | Status: DC

## 2017-10-23 NOTE — Progress Notes (Signed)
   Subjective:    Patient ID: Lenon Kuennen, male    DOB: 29-Jan-1961, 57 y.o.   MRN: [id number]  HPI Here for worsening chest tightness and coughing up yellow sputum. This started about 2 weeks ago. We saw him last week and gave him Doxycycline, but this has not helped. No fever or chest pain. He gets SOB at times.    Review of Systems  Constitutional: Negative.   HENT: Positive for congestion, postnasal drip and sore throat. Negative for ear pain, sinus pressure and sinus pain.   Eyes: Negative.   Respiratory: Positive for cough, chest tightness, shortness of breath and wheezing.   Cardiovascular: Negative.        Objective:   Physical Exam  Constitutional: He appears well-developed and well-nourished.  HENT:  Right Ear: External ear normal.  Left Ear: External ear normal.  Nose: Nose normal.  Mouth/Throat: Oropharynx is clear and moist.  Eyes: Conjunctivae are normal.  Neck: No thyromegaly present.  Cardiovascular: Normal rate, regular rhythm, normal heart sounds and intact distal pulses.  Pulmonary/Chest: Effort normal. No stridor. He has no rales.  Scattered rhonchi and wheezes  Lymphadenopathy:    He has no cervical adenopathy.          Assessment & Plan:  Bronchitis with reactive airways . Treat with Levaquin and a Medrol dose pack. Add a Ventolin inhaler prn.  Alysia Penna, MD

## 2017-12-14 ENCOUNTER — Other Ambulatory Visit: Payer: Self-pay | Admitting: Family Medicine

## 2017-12-17 ENCOUNTER — Other Ambulatory Visit: Payer: Self-pay | Admitting: Family Medicine

## 2017-12-17 NOTE — Telephone Encounter (Signed)
Call in #[phone] with 5 rf 

## 2017-12-17 NOTE — Telephone Encounter (Signed)
Last OV 10/23/2017   Last refilled 07/20/2017 disp 60 with 5 refills.  Sent to PCP for approval

## 2017-12-18 ENCOUNTER — Other Ambulatory Visit: Payer: Self-pay | Admitting: Family Medicine

## 2017-12-18 NOTE — Telephone Encounter (Signed)
Last OV 10/23/2017   Last refilled 12/18/2017 disp 60 with 5 refills   Sent to PCP for approval

## 2017-12-25 ENCOUNTER — Encounter: Payer: Self-pay | Admitting: Family Medicine

## 2017-12-25 ENCOUNTER — Ambulatory Visit: Payer: 59 | Admitting: Family Medicine

## 2017-12-25 VITALS — BP 120/80 | HR 104 | Temp 98.3°F | Ht 68.75 in | Wt 213.4 lb

## 2017-12-25 DIAGNOSIS — K5900 Constipation, unspecified: Secondary | ICD-10-CM

## 2017-12-25 NOTE — Progress Notes (Signed)
   Subjective:    Patient ID: Clemens Lachman, male    DOB: May 18, 1961, 57 y.o.   MRN: [id number]  HPI Here for one week of urgency to have BMs but when he tries only a small amount of mushy stool comes outl he as frequent BMs all through the day. No diarrhea. No fever. No nausea or abdominal pain. He does feel bloated often.    Review of Systems  Constitutional: Negative.   Respiratory: Negative.   Cardiovascular: Negative.   Gastrointestinal: Positive for abdominal distention. Negative for abdominal pain, anal bleeding, blood in stool, constipation, diarrhea, nausea and vomiting.       Objective:   Physical Exam  Constitutional: He appears well-developed and well-nourished.  Cardiovascular: Normal rate, normal heart sounds and intact distal pulses.  Pulmonary/Chest: Effort normal and breath sounds normal.  Abdominal: Soft. Bowel sounds are normal. He exhibits no distension and no mass. There is no tenderness. There is no rebound and no guarding. No hernia.          Assessment & Plan:  He is likely constipated. He will try magnesium citrate this afternoon. Otherwise he should use Miralax daily. Written out of work 12-23-17 through today. Alysia Penna, MD

## 2017-12-28 ENCOUNTER — Encounter: Payer: Self-pay | Admitting: Internal Medicine

## 2017-12-28 ENCOUNTER — Ambulatory Visit: Payer: 59 | Admitting: Internal Medicine

## 2017-12-28 VITALS — BP 120/62 | HR 117 | Temp 98.4°F | Wt 218.2 lb

## 2017-12-28 DIAGNOSIS — K5900 Constipation, unspecified: Secondary | ICD-10-CM

## 2017-12-28 DIAGNOSIS — D374 Neoplasm of uncertain behavior of colon: Secondary | ICD-10-CM

## 2017-12-28 NOTE — Progress Notes (Signed)
Subjective:    Patient ID: Christopher Solis, male    DOB: [birth year], 57 y.o.   MRN: [id number]  HPI  57 year old patient who is seen today for follow-up of some constipation issues.  He was seen by his PCP 3 days ago with a history of small compacted stool urgency associated with some nausea.  Symptoms began several days prior.  He was treated with magnesium citrate on 2 successive days and has improved.  His main complaint is some left lower quadrant discomfort as well as some frequent urgency.  He did have a normal bowel movement earlier today.  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  He states that he has had similar episodes of left lower quadrant pain intermittently since this surgery. He was given a note Friday to return to work today.  He states he needs another note to return to work tomorrow  Past Medical History:  Diagnosis Date  . Allergy   . Colon polyps    adenomatous  . Depression   . Low testosterone   . Osteoarthritis   . Rectal mass    tubulovillous adenoma, s/p surgical resection     Social History   Socioeconomic History  . Marital status: Single    Spouse name: Not on file  . Number of children: 1  . Years of education: Not on file  . Highest education level: Not on file  Occupational History  . Occupation: Therapist, occupational  Social Needs  . Financial resource strain: Not on file  . Food insecurity:    Worry: Not on file    Inability: Not on file  . Transportation needs:    Medical: Not on file    Non-medical: Not on file  Tobacco Use  . Smoking status: Current Every Day Smoker    Years: 37.00    Types: Cigarettes  . Smokeless tobacco: Former Systems developer    Quit date: 06/22/1980  Substance and Sexual Activity  . Alcohol use: No    Alcohol/week: 0.0 oz  . Drug use: No  . Sexual activity: Not on file  Lifestyle  . Physical activity:    Days per week: Not on file   Minutes per session: Not on file  . Stress: Not on file  Relationships  . Social connections:    Talks on phone: Not on file    Gets together: Not on file    Attends religious service: Not on file    Active member of club or organization: Not on file    Attends meetings of clubs or organizations: Not on file    Relationship status: Not on file  . Intimate partner violence:    Fear of current or ex partner: Not on file    Emotionally abused: Not on file    Physically abused: Not on file    Forced sexual activity: Not on file  Other Topics Concern  . Not on file  Social History Narrative  . Not on file    Past Surgical History:  Procedure Laterality Date  . COLON SURGERY  07/20/2013   removal of tubulovillous polyp in rectosigmoid colon at [facility]   . COLONOSCOPY  06/14/2015   per Dr. Havery Moros, adenomatous polyps, repeat in 3 years   . ELBOW SURGERY Right    reconstructive / right elbow  . TONSILLECTOMY    . VASECTOMY    . WISDOM TOOTH EXTRACTION  Family History  Problem Relation Age of Onset  . Arthritis Mother   . Coronary artery disease Mother   . Depression Mother   . Hypertension Mother   . Colon polyps Mother   . Colon polyps Father   . Hypertension Brother   . Heart disease Brother   . Hyperlipidemia Brother   . Colon cancer Other        maternal great grandmother  . Stomach cancer Paternal Grandmother     Allergies  Allergen Reactions  . Morphine Nausea And Vomiting  . Amoxicillin     nausea  . Propoxyphene N-Acetaminophen     Causes migraine, Darvocet N    Current Outpatient Medications on File Prior to Visit  Medication Sig Dispense Refill  . albuterol (PROVENTIL HFA;VENTOLIN HFA) 108 (90 Base) MCG/ACT inhaler Inhale 2 puffs into the lungs every 4 (four) hours as needed for wheezing or shortness of breath. 1 Inhaler 2  . cyclobenzaprine (FLEXERIL) 10 MG tablet TAKE 1 TABLET BY MOUTH THREE TIMES DAILY AS NEEDED FOR MUSCLE SPASM 60 tablet  5  . loratadine (CLARITIN) 10 MG tablet Take 10 mg by mouth daily.    . montelukast (SINGULAIR) 10 MG tablet TAKE 1 TABLET BY MOUTH ONCE DAILY AT BEDTIME 30 tablet 6  . omeprazole (PRILOSEC) 40 MG capsule Take 1 capsule (40 mg total) by mouth daily. 90 capsule 3  . ondansetron (ZOFRAN) 8 MG tablet TAKE ONE TABLET BY MOUTH EVERY 6 HOURS AS NEEDED FOR NAUSEA AND FOR VOMITING 30 tablet 2  . VIAGRA 100 MG tablet TAKE 1 TAB BY MOUTH ONCE A DAY AS NEEDED FOR ERECTILE DYSFUNCTION 10 tablet 0   No current facility-administered medications on file prior to visit.     BP 120/62 (BP Location: Left Arm, Patient Position: Sitting, Cuff Size: Large)   Pulse (!) 117   Temp 98.4 F (36.9 C) (Oral)   Wt 218 lb 3.2 oz (99 kg)   SpO2 93%   BMI 32.46 kg/m     Review of Systems  Constitutional: Negative for appetite change, chills, fatigue and fever.  HENT: Negative for congestion, dental problem, ear pain, hearing loss, sore throat, tinnitus, trouble swallowing and voice change.   Eyes: Negative for pain, discharge and visual disturbance.  Respiratory: Negative for cough, chest tightness, wheezing and stridor.   Cardiovascular: Negative for chest pain, palpitations and leg swelling.  Gastrointestinal: Positive for abdominal pain and constipation. Negative for abdominal distention, blood in stool, diarrhea, nausea and vomiting.  Genitourinary: Negative for difficulty urinating, discharge, flank pain, genital sores, hematuria and urgency.  Musculoskeletal: Negative for arthralgias, back pain, gait problem, joint swelling, myalgias and neck stiffness.  Skin: Negative for rash.  Neurological: Negative for dizziness, syncope, speech difficulty, weakness, numbness and headaches.  Hematological: Negative for adenopathy. Does not bruise/bleed easily.  Psychiatric/Behavioral: Negative for behavioral problems and dysphoric mood. The patient is not nervous/anxious.        Objective:   Physical Exam    Constitutional: He is oriented to person, place, and time. He appears well-developed.  HENT:  Head: Normocephalic.  Right Ear: External ear normal.  Left Ear: External ear normal.  Well-hydrated  Eyes: Conjunctivae and EOM are normal.  Neck: Normal range of motion.  Cardiovascular: Normal rate and normal heart sounds.  Pulmonary/Chest: Breath sounds normal.  Abdominal: Bowel sounds are normal.  Normal bowel sounds Minimal left lower quadrant tenderness to deep palpation  Musculoskeletal: Normal range of motion. He exhibits no edema or tenderness.  Neurological: He is alert and oriented to person, place, and time.  Psychiatric: He has a normal mood and affect. His behavior is normal.          Assessment & Plan:   Constipation improved Status post partial colectomy Intermittent left lower quadrant abdominal pain.  Improving  Patient requested a note to return to work tomorrow.  This was dispensed Daily MiraLAX until bowel habits more regular encouraged High-fiber diet and liberal fluid intake recommended  Marletta Lor

## 2018-01-25 ENCOUNTER — Other Ambulatory Visit: Payer: Self-pay | Admitting: Family Medicine

## 2018-04-12 ENCOUNTER — Encounter: Payer: Self-pay | Admitting: Family Medicine

## 2018-04-12 ENCOUNTER — Encounter

## 2018-04-12 ENCOUNTER — Ambulatory Visit: Payer: 59 | Admitting: Family Medicine

## 2018-04-12 VITALS — BP 144/84 | HR 120 | Temp 98.0°F | Wt 214.0 lb

## 2018-04-12 DIAGNOSIS — D171 Benign lipomatous neoplasm of skin and subcutaneous tissue of trunk: Secondary | ICD-10-CM | POA: Diagnosis not present

## 2018-04-12 DIAGNOSIS — M542 Cervicalgia: Secondary | ICD-10-CM | POA: Diagnosis not present

## 2018-04-12 MED ORDER — METHOCARBAMOL 750 MG PO TABS: 750.0000 mg | ORAL_TABLET | Freq: Four times a day (QID) | ORAL | 2 refills | Status: DC

## 2018-04-12 MED ORDER — ALBUTEROL SULFATE HFA 108 (90 BASE) MCG/ACT IN AERS: 2.0000 | INHALATION_SPRAY | RESPIRATORY_TRACT | 5 refills | Status: DC | PRN

## 2018-04-12 MED ORDER — METHYLPREDNISOLONE 4 MG PO TBPK: ORAL_TABLET | ORAL | 0 refills | Status: DC
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
## 2018-04-12 NOTE — Addendum Note (Signed)
Addended by: Alysia Penna A on: 04/12/2018 02:46 PM   Modules accepted: Orders, Level of Service

## 2018-04-21 ENCOUNTER — Ambulatory Visit: Payer: Self-pay | Admitting: Surgery

## 2018-04-21 DIAGNOSIS — D171 Benign lipomatous neoplasm of skin and subcutaneous tissue of trunk: Secondary | ICD-10-CM | POA: Diagnosis not present

## 2018-04-21 NOTE — H&P (Signed)
Christopher Solis Documented: 04/21/2018 4:00 PM Location: Christopher Solis Patient #: [id number] DOB: Oct 14, 1960 Single / Language: Christopher Solis / Race: White Male  History of Present Illness Christopher Solis A. Christopher Salce MD; 04/21/2018 4:57 PM) Patient words: Patient referred at the request of Dr. Velna Hatchet for a right buttock mass. It has been present for many years and is getting larger. Location is over the right buttock. There is no redness, fluctuance or drainage.  The patient is a 57 year old male.   Past Surgical History Christopher Solis, [name]; 04/21/2018 4:00 PM) Colon Removal - Partial Vasectomy  Diagnostic Studies History Christopher Solis, Christopher Solis; 04/21/2018 4:00 PM) Colonoscopy 1-5 years ago  Allergies Christopher Solis, CMA; 04/21/2018 4:03 PM) Darvocet A500 *ANALGESICS - OPIOID* Penicillins  Medication History Christopher Solis, CMA; 04/21/2018 4:03 PM) Cyclobenzaprine HCl (10MG  Tablet, Oral) Active. Montelukast Sodium (10MG  Tablet, Oral) Active. Methocarbamol (750MG  Tablet, Oral) Active. methylPREDNISolone (4MG  Tab Ther Pack, Oral) Active. Omeprazole (40MG  Capsule DR, Oral) Active. Sildenafil Citrate (100MG  Tablet, Oral) Active. Ventolin HFA (108 (90 Base)MCG/ACT Aerosol Soln, Inhalation) Active. Medications Reconciled  Social History Christopher Solis, [name]; 04/21/2018 4:00 PM) Alcohol use Remotely quit alcohol use. Caffeine use Carbonated beverages, Coffee. Tobacco use Current every day smoker.  Family History Christopher Solis, [name]; 04/21/2018 4:00 PM) Arthritis Brother, Mother. Colon Polyps Brother, Father, Mother. Hypertension Brother, Mother. Respiratory Condition Mother.  Other Problems Christopher Solis, [name]; 04/21/2018 4:00 PM) Arthritis Gastroesophageal Reflux Disease     Review of Systems Christopher Solis CMA; 04/21/2018 4:00 PM) General Not Present- Appetite Loss, Chills, Fatigue, Fever, Night Sweats, Weight Gain and Weight Loss. Skin  Not Present- Change in Wart/Mole, Dryness, Hives, Jaundice, New Lesions, Non-Healing Wounds, Rash and Ulcer. HEENT Present- Ringing in the Ears. Not Present- Earache, Hearing Loss, Hoarseness, Nose Bleed, Oral Ulcers, Seasonal Allergies, Sinus Pain, Sore Throat, Visual Disturbances, Wears glasses/contact lenses and Yellow Eyes. Respiratory Not Present- Bloody sputum, Chronic Cough, Difficulty Breathing, Snoring and Wheezing. Breast Not Present- Breast Mass, Breast Pain, Nipple Discharge and Skin Changes. Cardiovascular Not Present- Chest Pain, Difficulty Breathing Lying Down, Leg Cramps, Palpitations, Rapid Heart Rate, Shortness of Breath and Swelling of Extremities. Gastrointestinal Not Present- Abdominal Pain, Bloating, Bloody Stool, Change in Bowel Habits, Chronic diarrhea, Constipation, Difficulty Swallowing, Excessive gas, Gets full quickly at meals, Hemorrhoids, Indigestion, Nausea, Rectal Pain and Vomiting. Male Genitourinary Not Present- Blood in Urine, Change in Urinary Stream, Frequency, Impotence, Nocturia, Painful Urination, Urgency and Urine Leakage. Musculoskeletal Present- Joint Pain. Not Present- Back Pain, Joint Stiffness, Muscle Pain, Muscle Weakness and Swelling of Extremities. Neurological Not Present- Decreased Memory, Fainting, Headaches, Numbness, Seizures, Tingling, Tremor, Trouble walking and Weakness. Psychiatric Not Present- Anxiety, Bipolar, Change in Sleep Pattern, Depression, Fearful and Frequent crying. Endocrine Not Present- Cold Intolerance, Excessive Hunger, Hair Changes, Heat Intolerance, Hot flashes and New Diabetes. Hematology Not Present- Blood Thinners, Easy Bruising, Excessive bleeding, Gland problems, HIV and Persistent Infections.  Vitals Christopher Solis CMA; 04/21/2018 4:02 PM) 04/21/2018 4:01 PM Weight: 214 lb Height: 69in Body Surface Area: 2.13 m Body Mass Index: 31.6 kg/m  Temp.: 98.65F  Pulse: 116 (Regular)  BP: 140/80 (Sitting, Left  Arm, Standard)      Physical Exam (Christopher Solis A. Christopher Canul MD; 04/21/2018 4:57 PM)  General Mental Status-Alert. General Appearance-Consistent with stated age. Hydration-Well hydrated. Voice-Normal.  Integumentary Note: Is a 5 cm x 6 cm soft tissue mass right buttock mobile  Head and Neck Head-normocephalic, atraumatic with no lesions or palpable masses. Trachea-midline. Thyroid Gland Characteristics - normal size and consistency.  Eye Eyeball - Bilateral-Extraocular movements intact. Sclera/Conjunctiva - Bilateral-No scleral icterus.  Chest and Lung Exam Chest and lung exam reveals -quiet, even and easy respiratory effort with no use of accessory muscles and on auscultation, normal breath sounds, no adventitious sounds and normal vocal resonance. Inspection Chest Wall - Normal. Back - normal.  Cardiovascular Cardiovascular examination reveals -normal heart sounds, regular rate and rhythm with no murmurs and normal pedal pulses bilaterally.  Neurologic Neurologic evaluation reveals -alert and oriented x 3 with no impairment of recent or remote memory. Mental Status-Normal.  Musculoskeletal Normal Exam - Left-Upper Extremity Strength Normal and Lower Extremity Strength Normal. Normal Exam - Right-Upper Extremity Strength Normal and Lower Extremity Strength Normal.    Assessment & Plan (Christopher Solis A. 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 Risks, benefits and other options discussed. Smoking cessation strongly encouraged to wound complications.  Current Plans The pathophysiology of skin & subcutaneous masses was discussed. Natural history risks without Solis were discussed. I recommended Solis to remove the mass. I explained the technique of removal with use of local anesthesia & possible need for more aggressive sedation/anesthesia for patient  comfort.  Risks such as bleeding, infection, wound breakdown, heart attack, death, and other risks were discussed. I noted a good likelihood this will help address the problem. Possibility that this will not correct all symptoms was explained. Possibility of regrowth/recurrence of the mass was discussed. We will work to minimize complications. Questions were answered. The patient expresses understanding & wishes to proceed with Solis.  Pt Education - CCS STOP SMOKING!

## 2018-04-21 NOTE — H&P (View-Only) (Signed)
Christopher Solis Documented: 04/21/2018 4:00 PM Location: [facility] Patient #: [id number] DOB: 30-Aug-1960 Single / Language: Christopher Solis / Race: White Male  History of Present Illness Christopher Solis A. [name] MD; 04/21/2018 4:57 PM) Patient words: Patient referred at the request of Dr. Velna Hatchet for a right buttock mass. It has been present for many years and is getting larger. Location is over the right buttock. There is no redness, fluctuance or drainage.  The patient is a 57 year old male.   Past Surgical History Christopher Solis, [name]; 04/21/2018 4:00 PM) Colon Removal - Partial Vasectomy  Diagnostic Studies History Christopher Solis, [place]; 04/21/2018 4:00 PM) Colonoscopy 1-5 years ago  Allergies Christopher Solis, Christopher Solis; 04/21/2018 4:03 PM) Darvocet A500 *ANALGESICS - OPIOID* Penicillins  Medication History Christopher Solis, Christopher Solis; 04/21/2018 4:03 PM) Cyclobenzaprine HCl (10MG  Tablet, Oral) Active. Montelukast Sodium (10MG  Tablet, Oral) Active. Methocarbamol (750MG  Tablet, Oral) Active. methylPREDNISolone (4MG  Tab Ther Pack, Oral) Active. Omeprazole (40MG  Capsule DR, Oral) Active. Sildenafil Citrate (100MG  Tablet, Oral) Active. Ventolin HFA (108 (90 Base)MCG/ACT Aerosol Soln, Inhalation) Active. Medications Reconciled  Social History Christopher Solis, [name]; 04/21/2018 4:00 PM) Alcohol use Remotely quit alcohol use. Caffeine use Carbonated beverages, Coffee. Tobacco use Current every day smoker.  Family History Christopher Solis, [name]; 04/21/2018 4:00 PM) Arthritis Brother, Mother. Colon Polyps Brother, Father, Mother. Hypertension Brother, Mother. Respiratory Condition Mother.  Other Problems Christopher Solis, [place]; 04/21/2018 4:00 PM) Arthritis Gastroesophageal Reflux Disease     Review of Systems Christopher Solis Christopher Solis; 04/21/2018 4:00 PM) General Not Present- Appetite Loss, Chills, Fatigue, Fever, Night Sweats, Weight Gain and Weight Loss. Skin  Not Present- Change in Wart/Mole, Dryness, Hives, Jaundice, New Lesions, Non-Healing Wounds, Rash and Ulcer. HEENT Present- Ringing in the Ears. Not Present- Earache, Hearing Loss, Hoarseness, Nose Bleed, Oral Ulcers, Seasonal Allergies, Sinus Pain, Sore Throat, Visual Disturbances, Wears glasses/contact lenses and Yellow Eyes. Respiratory Not Present- Bloody sputum, Chronic Cough, Difficulty Breathing, Snoring and Wheezing. Breast Not Present- Breast Mass, Breast Pain, Nipple Discharge and Skin Changes. Cardiovascular Not Present- Chest Pain, Difficulty Breathing Lying Down, Leg Cramps, Palpitations, Rapid Heart Rate, Shortness of Breath and Swelling of Extremities. Gastrointestinal Not Present- Abdominal Pain, Bloating, Bloody Stool, Change in Bowel Habits, Chronic diarrhea, Constipation, Difficulty Swallowing, Excessive gas, Gets full quickly at meals, Hemorrhoids, Indigestion, Nausea, Rectal Pain and Vomiting. Male Genitourinary Not Present- Blood in Urine, Change in Urinary Stream, Frequency, Impotence, Nocturia, Painful Urination, Urgency and Urine Leakage. Musculoskeletal Present- Joint Pain. Not Present- Back Pain, Joint Stiffness, Muscle Pain, Muscle Weakness and Swelling of Extremities. Neurological Not Present- Decreased Memory, Fainting, Headaches, Numbness, Seizures, Tingling, Tremor, Trouble walking and Weakness. Psychiatric Not Present- Anxiety, Bipolar, Change in Sleep Pattern, Depression, Fearful and Frequent crying. Endocrine Not Present- Cold Intolerance, Excessive Hunger, Hair Changes, Heat Intolerance, Hot flashes and New Diabetes. Hematology Not Present- Blood Thinners, Easy Bruising, Excessive bleeding, Gland problems, HIV and Persistent Infections.  Vitals Christopher Solis Christopher Solis; 04/21/2018 4:02 PM) 04/21/2018 4:01 PM Weight: 214 lb Height: 69in Body Surface Area: 2.13 m Body Mass Index: 31.6 kg/m  Temp.: 98.77F  Pulse: 116 (Regular)  BP: 140/80 (Sitting, Left  Arm, Standard)      Physical Exam (Emelin Dascenzo A. Maire Govan MD; 04/21/2018 4:57 PM)  General Mental Status-Alert. General Appearance-Consistent with stated age. Hydration-Well hydrated. Voice-Normal.  Integumentary Note: Is a 5 cm x 6 cm soft tissue mass right buttock mobile  Head and Neck Head-normocephalic, atraumatic with no lesions or palpable masses. Trachea-midline. Thyroid Gland Characteristics - normal size and consistency.  Eye Eyeball - Bilateral-Extraocular movements intact. Sclera/Conjunctiva - Bilateral-No scleral icterus.  Chest and Lung Exam Chest and lung exam reveals -quiet, even and easy respiratory effort with no use of accessory muscles and on auscultation, normal breath sounds, no adventitious sounds and normal vocal resonance. Inspection Chest Wall - Normal. Back - normal.  Cardiovascular Cardiovascular examination reveals -normal heart sounds, regular rate and rhythm with no murmurs and normal pedal pulses bilaterally.  Neurologic Neurologic evaluation reveals -alert and oriented x 3 with no impairment of recent or remote memory. Mental Status-Normal.  Musculoskeletal Normal Exam - Left-Upper Extremity Strength Normal and Lower Extremity Strength Normal. Normal Exam - Right-Upper Extremity Strength Normal and Lower Extremity Strength Normal.    Assessment & Plan (Thaddaeus Granja A. 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 Risks, benefits and other options discussed. Smoking cessation strongly encouraged to wound complications.  Current Plans The pathophysiology of skin & subcutaneous masses was discussed. Natural history risks without surgery were discussed. I recommended surgery to remove the mass. I explained the technique of removal with use of local anesthesia & possible need for more aggressive sedation/anesthesia for patient  comfort.  Risks such as bleeding, infection, wound breakdown, heart attack, death, and other risks were discussed. I noted a good likelihood this will help address the problem. Possibility that this will not correct all symptoms was explained. Possibility of regrowth/recurrence of the mass was discussed. We will work to minimize complications. Questions were answered. The patient expresses understanding & wishes to proceed with surgery.  Pt Education - CCS STOP SMOKING!

## 2018-04-30 ENCOUNTER — Encounter (HOSPITAL_BASED_OUTPATIENT_CLINIC_OR_DEPARTMENT_OTHER): Payer: Self-pay | Admitting: *Deleted

## 2018-04-30 ENCOUNTER — Other Ambulatory Visit: Payer: Self-pay

## 2018-05-03 ENCOUNTER — Other Ambulatory Visit: Payer: Self-pay | Admitting: Family Medicine

## 2018-05-10 ENCOUNTER — Encounter (HOSPITAL_BASED_OUTPATIENT_CLINIC_OR_DEPARTMENT_OTHER)
Admission: RE | Admit: 2018-05-10 | Discharge: 2018-05-10 | Disposition: A | Discharge status: Home / Self Care | Payer: 59 | Source: Ambulatory Visit | Attending: Surgery | Admitting: Surgery

## 2018-05-10 DIAGNOSIS — K219 Gastro-esophageal reflux disease without esophagitis: Secondary | ICD-10-CM | POA: Diagnosis not present

## 2018-05-10 DIAGNOSIS — E669 Obesity, unspecified: Secondary | ICD-10-CM | POA: Diagnosis not present

## 2018-05-10 DIAGNOSIS — Z6831 Body mass index (BMI) 31.0-31.9, adult: Secondary | ICD-10-CM | POA: Diagnosis not present

## 2018-05-10 DIAGNOSIS — F17210 Nicotine dependence, cigarettes, uncomplicated: Secondary | ICD-10-CM | POA: Diagnosis not present

## 2018-05-10 DIAGNOSIS — D171 Benign lipomatous neoplasm of skin and subcutaneous tissue of trunk: Secondary | ICD-10-CM | POA: Diagnosis not present

## 2018-05-10 DIAGNOSIS — M1990 Unspecified osteoarthritis, unspecified site: Secondary | ICD-10-CM | POA: Diagnosis not present

## 2018-05-10 LAB — CBC WITH DIFFERENTIAL/PLATELET
ABS IMMATURE GRANULOCYTES: 0.13 10*3/uL — AB (ref 0.00–0.07)
BASOS PCT: 1 %
Basophils Absolute: 0.1 10*3/uL (ref 0.0–0.1)
Eosinophils Absolute: 0.1 10*3/uL (ref 0.0–0.5)
Eosinophils Relative: 1 %
HCT: 45.8 % (ref 39.0–52.0)
HEMOGLOBIN: 15.8 g/dL (ref 13.0–17.0)
IMMATURE GRANULOCYTES: 1 %
LYMPHS PCT: 26 %
Lymphs Abs: 2.5 10*3/uL (ref 0.7–4.0)
MCH: 31.9 pg (ref 26.0–34.0)
MCHC: 34.5 g/dL (ref 30.0–36.0)
MCV: 92.5 fL (ref 80.0–100.0)
MONO ABS: 1.2 10*3/uL — AB (ref 0.1–1.0)
Monocytes Relative: 12 %
NEUTROS ABS: 5.5 10*3/uL (ref 1.7–7.7)
NEUTROS PCT: 59 %
PLATELETS: 333 10*3/uL (ref 150–400)
RBC: 4.95 MIL/uL (ref 4.22–5.81)
RDW: 11.9 % (ref 11.5–15.5)
WBC: 9.5 10*3/uL (ref 4.0–10.5)
nRBC: 0 % (ref 0.0–0.2)

## 2018-05-10 LAB — COMPREHENSIVE METABOLIC PANEL
ALBUMIN: 4.1 g/dL (ref 3.5–5.0)
ALT: 22 U/L (ref 0–44)
AST: 21 U/L (ref 15–41)
Alkaline Phosphatase: 72 U/L (ref 38–126)
Anion gap: 12 (ref 5–15)
BUN: 11 mg/dL (ref 6–20)
CALCIUM: 9.2 mg/dL (ref 8.9–10.3)
CHLORIDE: 101 mmol/L (ref 98–111)
CO2: 24 mmol/L (ref 22–32)
CREATININE: 1.14 mg/dL (ref 0.61–1.24)
GFR calc non Af Amer: >60 mL/min (ref >60)
GLUCOSE: 92 mg/dL (ref 70–99)
Potassium: 3.8 mmol/L (ref 3.5–5.1)
SODIUM: 137 mmol/L (ref 135–145)
Total Bilirubin: 0.3 mg/dL (ref 0.3–1.2)
Total Protein: 7.2 g/dL (ref 6.5–8.1)

## 2018-05-10 NOTE — Progress Notes (Signed)
Both the CHG soap and pre surgical ensure given along the instructions for each.

## 2018-05-11 ENCOUNTER — Ambulatory Visit (HOSPITAL_BASED_OUTPATIENT_CLINIC_OR_DEPARTMENT_OTHER): Payer: 59 | Admitting: Anesthesiology

## 2018-05-11 ENCOUNTER — Encounter (HOSPITAL_BASED_OUTPATIENT_CLINIC_OR_DEPARTMENT_OTHER)
Admission: RE | Disposition: A | Discharge status: Home / Self Care | Payer: Self-pay | Source: Ambulatory Visit | Attending: Surgery

## 2018-05-11 ENCOUNTER — Encounter (HOSPITAL_BASED_OUTPATIENT_CLINIC_OR_DEPARTMENT_OTHER): Payer: Self-pay

## 2018-05-11 ENCOUNTER — Ambulatory Visit (HOSPITAL_BASED_OUTPATIENT_CLINIC_OR_DEPARTMENT_OTHER)
Admission: RE | Admit: 2018-05-11 | Discharge: 2018-05-11 | Disposition: A | Discharge status: Home / Self Care | Payer: 59 | Source: Ambulatory Visit | Attending: Surgery | Admitting: Surgery

## 2018-05-11 ENCOUNTER — Other Ambulatory Visit: Payer: Self-pay

## 2018-05-11 DIAGNOSIS — D1739 Benign lipomatous neoplasm of skin and subcutaneous tissue of other sites: Secondary | ICD-10-CM | POA: Diagnosis not present

## 2018-05-11 DIAGNOSIS — F17210 Nicotine dependence, cigarettes, uncomplicated: Secondary | ICD-10-CM | POA: Insufficient documentation

## 2018-05-11 DIAGNOSIS — M1990 Unspecified osteoarthritis, unspecified site: Secondary | ICD-10-CM | POA: Insufficient documentation

## 2018-05-11 DIAGNOSIS — Z6831 Body mass index (BMI) 31.0-31.9, adult: Secondary | ICD-10-CM | POA: Insufficient documentation

## 2018-05-11 DIAGNOSIS — D171 Benign lipomatous neoplasm of skin and subcutaneous tissue of trunk: Secondary | ICD-10-CM | POA: Insufficient documentation

## 2018-05-11 DIAGNOSIS — E669 Obesity, unspecified: Secondary | ICD-10-CM | POA: Insufficient documentation

## 2018-05-11 DIAGNOSIS — K219 Gastro-esophageal reflux disease without esophagitis: Secondary | ICD-10-CM | POA: Insufficient documentation

## 2018-05-11 HISTORY — PX: MASS EXCISION: SHX2000

## 2018-05-11 SURGERY — EXCISION MASS
Anesthesia: Monitor Anesthesia Care | Site: Buttocks | Laterality: Right

## 2018-05-11 MED ORDER — FENTANYL CITRATE (PF) 100 MCG/2ML IJ SOLN: 25.0000 ug | INTRAMUSCULAR | Status: DC | PRN

## 2018-05-11 MED ORDER — FENTANYL CITRATE (PF) 100 MCG/2ML IJ SOLN
INTRAMUSCULAR | Status: AC
  Filled 2018-05-11: qty 2

## 2018-05-11 MED ORDER — HEPARIN SOD (PORK) LOCK FLUSH 100 UNIT/ML IV SOLN
INTRAVENOUS | Status: AC
  Filled 2018-05-11: qty 5

## 2018-05-11 MED ORDER — CLINDAMYCIN PHOSPHATE 900 MG/50ML IV SOLN
INTRAVENOUS | Status: AC
  Filled 2018-05-11: qty 50

## 2018-05-11 MED ORDER — FENTANYL CITRATE (PF) 100 MCG/2ML IJ SOLN: 50.0000 ug | INTRAMUSCULAR | Status: DC | PRN

## 2018-05-11 MED ORDER — CLINDAMYCIN PHOSPHATE 900 MG/50ML IV SOLN
900.0000 mg | INTRAVENOUS | Status: AC
  Administered 2018-05-11: 900 mg via INTRAVENOUS

## 2018-05-11 MED ORDER — MIDAZOLAM HCL 2 MG/2ML IJ SOLN
INTRAMUSCULAR | Status: AC
  Filled 2018-05-11: qty 2

## 2018-05-11 MED ORDER — PROMETHAZINE HCL 25 MG/ML IJ SOLN: 6.2500 mg | INTRAMUSCULAR | Status: DC | PRN

## 2018-05-11 MED ORDER — BUPIVACAINE HCL (PF) 0.25 % IJ SOLN
INTRAMUSCULAR | Status: AC
  Filled 2018-05-11: qty 60

## 2018-05-11 MED ORDER — SCOPOLAMINE 1 MG/3DAYS TD PT72: 1.0000 | MEDICATED_PATCH | Freq: Once | TRANSDERMAL | Status: DC | PRN

## 2018-05-11 MED ORDER — LIDOCAINE 2% (20 MG/ML) 5 ML SYRINGE
INTRAMUSCULAR | Status: AC
  Filled 2018-05-11: qty 5

## 2018-05-11 MED ORDER — MIDAZOLAM HCL 5 MG/5ML IJ SOLN
INTRAMUSCULAR | Status: DC | PRN
  Administered 2018-05-11: 2 mg via INTRAVENOUS

## 2018-05-11 MED ORDER — IBUPROFEN 800 MG PO TABS: 800.0000 mg | ORAL_TABLET | Freq: Three times a day (TID) | ORAL | 0 refills | Status: DC | PRN

## 2018-05-11 MED ORDER — PROPOFOL 500 MG/50ML IV EMUL
INTRAVENOUS | Status: DC | PRN
  Administered 2018-05-11: 50 ug/kg/min via INTRAVENOUS

## 2018-05-11 MED ORDER — BUPIVACAINE-EPINEPHRINE (PF) 0.25% -1:200000 IJ SOLN
INTRAMUSCULAR | Status: AC
  Filled 2018-05-11: qty 60

## 2018-05-11 MED ORDER — OXYCODONE HCL 5 MG PO TABS: 5.0000 mg | ORAL_TABLET | Freq: Four times a day (QID) | ORAL | 0 refills | Status: DC | PRN

## 2018-05-11 MED ORDER — CELECOXIB 400 MG PO CAPS
400.0000 mg | ORAL_CAPSULE | ORAL | Status: AC
  Administered 2018-05-11: 400 mg via ORAL

## 2018-05-11 MED ORDER — GABAPENTIN 300 MG PO CAPS
ORAL_CAPSULE | ORAL | Status: AC
  Filled 2018-05-11: qty 1

## 2018-05-11 MED ORDER — MIDAZOLAM HCL 2 MG/2ML IJ SOLN: 1.0000 mg | INTRAMUSCULAR | Status: DC | PRN

## 2018-05-11 MED ORDER — LACTATED RINGERS IV SOLN
INTRAVENOUS | Status: DC
  Administered 2018-05-11 (×2): via INTRAVENOUS

## 2018-05-11 MED ORDER — GABAPENTIN 300 MG PO CAPS
300.0000 mg | ORAL_CAPSULE | ORAL | Status: AC
  Administered 2018-05-11: 300 mg via ORAL

## 2018-05-11 MED ORDER — HEPARIN (PORCINE) IN NACL 1000-0.9 UT/500ML-% IV SOLN
INTRAVENOUS | Status: AC
  Filled 2018-05-11: qty 500

## 2018-05-11 MED ORDER — CHLORHEXIDINE GLUCONATE CLOTH 2 % EX PADS: 6.0000 | MEDICATED_PAD | Freq: Once | CUTANEOUS | Status: DC

## 2018-05-11 MED ORDER — ONDANSETRON HCL 4 MG/2ML IJ SOLN
INTRAMUSCULAR | Status: DC | PRN
  Administered 2018-05-11: 4 mg via INTRAVENOUS

## 2018-05-11 MED ORDER — KETOROLAC TROMETHAMINE 15 MG/ML IJ SOLN: 15.0000 mg | INTRAMUSCULAR | Status: DC

## 2018-05-11 MED ORDER — ONDANSETRON HCL 4 MG/2ML IJ SOLN
INTRAMUSCULAR | Status: AC
  Filled 2018-05-11: qty 2

## 2018-05-11 MED ORDER — CELECOXIB 200 MG PO CAPS
ORAL_CAPSULE | ORAL | Status: AC
  Filled 2018-05-11: qty 2

## 2018-05-11 MED ORDER — FENTANYL CITRATE (PF) 100 MCG/2ML IJ SOLN
INTRAMUSCULAR | Status: DC | PRN
  Administered 2018-05-11 (×2): 50 ug via INTRAVENOUS

## 2018-05-11 MED ORDER — BUPIVACAINE-EPINEPHRINE 0.25% -1:200000 IJ SOLN
INTRAMUSCULAR | Status: DC | PRN
  Administered 2018-05-11: 30 mL

## 2018-05-11 MED ORDER — PROPOFOL 10 MG/ML IV BOLUS
INTRAVENOUS | Status: AC
  Filled 2018-05-11: qty 20

## 2018-05-11 SURGICAL SUPPLY — 48 items
ADH SKN CLS APL DERMABOND .7 (GAUZE/BANDAGES/DRESSINGS)
APL SKNCLS STERI-STRIP NONHPOA (GAUZE/BANDAGES/DRESSINGS)
BENZOIN TINCTURE PRP APPL 2/3 (GAUZE/BANDAGES/DRESSINGS) IMPLANT
BLADE SURG 10 STRL SS (BLADE) IMPLANT
BLADE SURG 15 STRL LF DISP TIS (BLADE) ×1 IMPLANT
BLADE SURG 15 STRL SS (BLADE) ×3
CANISTER SUCT 1200ML W/VALVE (MISCELLANEOUS) IMPLANT
CHLORAPREP W/TINT 26ML (MISCELLANEOUS) ×3 IMPLANT
CLOSURE WOUND 1/2 X4 (GAUZE/BANDAGES/DRESSINGS)
COVER BACK TABLE 60X90IN (DRAPES) ×3 IMPLANT
COVER MAYO STAND STRL (DRAPES) ×3 IMPLANT
COVER WAND RF STERILE (DRAPES) IMPLANT
DECANTER SPIKE VIAL GLASS SM (MISCELLANEOUS) IMPLANT
DERMABOND ADVANCED (GAUZE/BANDAGES/DRESSINGS)
DERMABOND ADVANCED .7 DNX12 (GAUZE/BANDAGES/DRESSINGS) IMPLANT
DRAPE LAPAROTOMY 100X72 PEDS (DRAPES) ×3 IMPLANT
DRAPE UTILITY XL STRL (DRAPES) ×3 IMPLANT
ELECT COATED BLADE 2.86 ST (ELECTRODE) ×3 IMPLANT
ELECT REM PT RETURN 9FT ADLT (ELECTROSURGICAL) ×3
ELECTRODE REM PT RTRN 9FT ADLT (ELECTROSURGICAL) ×1 IMPLANT
GLOVE BIO SURGEON STRL SZ7 (GLOVE) ×2 IMPLANT
GLOVE BIOGEL PI IND STRL 7.0 (GLOVE) IMPLANT
GLOVE BIOGEL PI IND STRL 7.5 (GLOVE) IMPLANT
GLOVE BIOGEL PI IND STRL 8 (GLOVE) ×1 IMPLANT
GLOVE BIOGEL PI INDICATOR 7.0 (GLOVE) ×2
GLOVE BIOGEL PI INDICATOR 7.5 (GLOVE) ×2
GLOVE BIOGEL PI INDICATOR 8 (GLOVE) ×2
GLOVE ECLIPSE 8.0 STRL XLNG CF (GLOVE) ×3 IMPLANT
GOWN STRL REUS W/ TWL LRG LVL3 (GOWN DISPOSABLE) ×2 IMPLANT
GOWN STRL REUS W/TWL LRG LVL3 (GOWN DISPOSABLE) ×6
NDL HYPO 25X1 1.5 SAFETY (NEEDLE) ×1 IMPLANT
NEEDLE HYPO 25X1 1.5 SAFETY (NEEDLE) ×3 IMPLANT
NS IRRIG 1000ML POUR BTL (IV SOLUTION) IMPLANT
PACK BASIN DAY SURGERY FS (CUSTOM PROCEDURE TRAY) ×3 IMPLANT
PENCIL BUTTON HOLSTER BLD 10FT (ELECTRODE) ×3 IMPLANT
SLEEVE SCD COMPRESS KNEE MED (MISCELLANEOUS) ×3 IMPLANT
SPONGE LAP 4X18 RFD (DISPOSABLE) ×2 IMPLANT
STAPLER VISISTAT 35W (STAPLE) IMPLANT
STRIP CLOSURE SKIN 1/2X4 (GAUZE/BANDAGES/DRESSINGS) IMPLANT
SUT MON AB 4-0 PC3 18 (SUTURE) ×3 IMPLANT
SUT VICRYL 3-0 CR8 SH (SUTURE) ×3 IMPLANT
SUT VICRYL AB 3 0 TIES (SUTURE) IMPLANT
SYR CONTROL 10ML LL (SYRINGE) ×3 IMPLANT
TOWEL GREEN STERILE FF (TOWEL DISPOSABLE) ×6 IMPLANT
TOWEL OR NON WOVEN STRL DISP B (DISPOSABLE) ×3 IMPLANT
TUBE CONNECTING 20'X1/4 (TUBING) ×1
TUBE CONNECTING 20X1/4 (TUBING) ×1 IMPLANT
YANKAUER SUCT BULB TIP NO VENT (SUCTIONS) ×2 IMPLANT

## 2018-05-11 NOTE — Anesthesia Postprocedure Evaluation (Signed)
Anesthesia Post Note  Patient: Christopher Solis  Procedure(s) Performed: EXCISION RIGHT  BUTTOCK   MASS (Right Buttocks)     Patient location during evaluation: PACU Anesthesia Type: MAC Level of consciousness: awake and alert, oriented and awake Pain management: pain level controlled Vital Signs Assessment: post-procedure vital signs reviewed and stable Respiratory status: spontaneous breathing, nonlabored ventilation and respiratory function stable Cardiovascular status: stable and blood pressure returned to baseline Postop Assessment: no apparent nausea or vomiting Anesthetic complications: no    Last Vitals:  Vitals:   05/11/18 0844 05/11/18 0904  BP:  129/79  Pulse: 73 84  Resp: 19 20  Temp:  36.6 C  SpO2: 95% 95%    Last Pain:  Vitals:   05/11/18 0904  TempSrc:   PainSc: 0-No pain                 Catalina Gravel

## 2018-05-11 NOTE — Interval H&P Note (Signed)
History and Physical Interval Note:  05/11/2018 7:09 AM  Christopher Solis  has presented today for surgery, with the diagnosis of lipoma  The various methods of treatment have been discussed with the patient and family. After consideration of risks, benefits and other options for treatment, the patient has consented to  Procedure(s) with comments: EXCISION RIGHT  BUTTOCK   MASS ERAS PATHWAY (Right) - LOCAL as a surgical intervention .  The patient's history has been reviewed, patient examined, no change in status, stable for surgery.  I have reviewed the patient's chart and labs.  Questions were answered to the patient's satisfaction.     [place]

## 2018-05-11 NOTE — Discharge Instructions (Signed)
GENERAL SURGERY: POST OP INSTRUCTIONS ° °###################################################################### ° °EAT °Gradually transition to a high fiber diet with a fiber supplement over the next few weeks after discharge.  Start with a pureed / full liquid diet (see below) ° °WALK °Walk an hour a day.  Control your pain to do that.   ° °CONTROL PAIN °Control pain so that you can walk, sleep, tolerate sneezing/coughing, go up/down stairs. ° °HAVE A BOWEL MOVEMENT DAILY °Keep your bowels regular to avoid problems.  OK to try a laxative to override constipation.  OK to use an antidairrheal to slow down diarrhea.  Call if not better after 2 tries ° °CALL IF YOU HAVE PROBLEMS/CONCERNS °Call if you are still struggling despite following these instructions. °Call if you have concerns not answered by these instructions ° °###################################################################### ° ° ° °1. DIET: Follow a light bland diet the first 24 hours after arrival home, such as soup, liquids, crackers, etc.  Be sure to include lots of fluids daily.  Avoid fast food or heavy meals as your are more likely to get nauseated.   °2. Take your usually prescribed home medications unless otherwise directed. °3. PAIN CONTROL: °a. Pain is best controlled by a usual combination of three different methods TOGETHER: °i. Ice/Heat °ii. Over the counter pain medication °iii. Prescription pain medication °b. Most patients will experience some swelling and bruising around the incisions.  Ice packs or heating pads (30-60 minutes up to 6 times a day) will help. Use ice for the first few days to help decrease swelling and bruising, then switch to heat to help relax tight/sore spots and speed recovery.  Some people prefer to use ice alone, heat alone, alternating between ice & heat.  Experiment to what works for you.  Swelling and bruising can take several weeks to resolve.   °c. It is helpful to take an over-the-counter pain medication  regularly for the first few weeks.  Choose one of the following that works best for you: °i. Naproxen (Aleve, etc)  Two 220mg tabs twice a day °ii. Ibuprofen (Advil, etc) Three 200mg tabs four times a day (every meal & bedtime) °iii. Acetaminophen (Tylenol, etc) 500-650mg four times a day (every meal & bedtime) °d. A  prescription for pain medication (such as oxycodone, hydrocodone, etc) should be given to you upon discharge.  Take your pain medication as prescribed.  °i. If you are having problems/concerns with the prescription medicine (does not control pain, nausea, vomiting, rash, itching, etc), please call us [phone] to see if we need to switch you to a different pain medicine that will work better for you and/or control your side effect better. °ii. If you need a refill on your pain medication, please contact your pharmacy.  They will contact our office to request authorization. Prescriptions will not be filled after 5 pm or on week-ends. °4. Avoid getting constipated.  Between the surgery and the pain medications, it is common to experience some constipation.  Increasing fluid intake and taking a fiber supplement (such as Metamucil, Citrucel, FiberCon, MiraLax, etc) 1-2 times a day regularly will usually help prevent this problem from occurring.  A mild laxative (prune juice, Milk of Magnesia, MiraLax, etc) should be taken according to package directions if there are no bowel movements after 48 hours.   °5. Wash / shower every day.  You may shower over the dressings as they are waterproof.  Continue to shower over incision(s) after the dressing is off. °6. Remove your waterproof bandages   5 days after surgery.  You may leave the incision open to air.  You may have skin tapes (Steri Strips) covering the incision(s).  Leave them on until one week, then remove.  You may replace a dressing/Band-Aid to cover the incision for comfort if you wish.  ° ° ° ° °7. ACTIVITIES as tolerated:   °a. You may resume  regular (light) daily activities beginning the next day--such as daily self-care, walking, climbing stairs--gradually increasing activities as tolerated.  If you can walk 30 minutes without difficulty, it is safe to try more intense activity such as jogging, treadmill, bicycling, low-impact aerobics, swimming, etc. °b. Save the most intensive and strenuous activity for last such as sit-ups, heavy lifting, contact sports, etc  Refrain from any heavy lifting or straining until you are off narcotics for pain control.   °c. DO NOT PUSH THROUGH PAIN.  Let pain be your guide: If it hurts to do something, don't do it.  Pain is your body warning you to avoid that activity for another week until the pain goes down. °d. You may drive when you are no longer taking prescription pain medication, you can comfortably wear a seatbelt, and you can safely maneuver your car and apply brakes. °e. You may have sexual intercourse when it is comfortable.  °8. FOLLOW UP in our office °a. Please call [facility] at [phone] to set up an appointment to see your surgeon in the office for a follow-up appointment approximately 2-3 weeks after your surgery. °b. Make sure that you call for this appointment the day you arrive home to insure a convenient appointment time. °9. IF YOU HAVE DISABILITY OR FAMILY LEAVE FORMS, BRING THEM TO THE OFFICE FOR PROCESSING.  DO NOT GIVE THEM TO YOUR DOCTOR. ° ° °WHEN TO CALL US [phone]: °1. Poor pain control °2. Reactions / problems with new medications (rash/itching, nausea, etc)  °3. Fever over 101.5 F (38.5 C) °4. Worsening swelling or bruising °5. Continued bleeding from incision. °6. Increased pain, redness, or drainage from the incision °7. Difficulty breathing / swallowing ° ° The clinic staff is available to answer your questions during regular business hours (8:30am-5pm).  Please don’t hesitate to call and ask to speak to one of our nurses for clinical concerns.  ° If you have a medical emergency,  go to the nearest emergency room or call 911. ° A surgeon from [facility] is always on call at the hospitals ° ° °[facility] °[address], [address], [place], [place]  [address] ? °MAIN: [phone] ? TOLL FREE: 1-800-359-8415 ?  °FAX [phone] °[email or web address] ° ° °Post Anesthesia Home Care Instructions ° °Activity: °Get plenty of rest for the remainder of the day. A responsible individual must stay with you for 24 hours following the procedure.  °For the next 24 hours, DO NOT: °-Drive a car °-Operate machinery °-Drink alcoholic beverages °-Take any medication unless instructed by your physician °-Make any legal decisions or sign important papers. ° °Meals: °Start with liquid foods such as gelatin or soup. Progress to regular foods as tolerated. Avoid greasy, spicy, heavy foods. If nausea and/or vomiting occur, drink only clear liquids until the nausea and/or vomiting subsides. Call your physician if vomiting continues. ° °Special Instructions/Symptoms: °Your throat may feel dry or sore from the anesthesia or the breathing tube placed in your throat during surgery. If this causes discomfort, gargle with warm salt water. The discomfort should disappear within 24 hours. ° °  If you had a scopolamine patch placed behind your ear for the management of post- operative nausea and/or vomiting: ° °1. The medication in the patch is effective for 72 hours, after which it should be removed.  Wrap patch in a tissue and discard in the trash. Wash hands thoroughly with soap and water. °2. You may remove the patch earlier than 72 hours if you experience unpleasant side effects which may include dry mouth, dizziness or visual disturbances. °3. Avoid touching the patch. Wash your hands with soap and water after contact with the patch. °  ° °

## 2018-05-11 NOTE — Transfer of Care (Signed)
Immediate Anesthesia Transfer of Care Note  Patient: Christopher Solis  Procedure(s) Performed: EXCISION RIGHT  BUTTOCK   MASS (Right Buttocks)  Patient Location: PACU  Anesthesia Type:MAC  Level of Consciousness: awake, alert  and oriented  Airway & Oxygen Therapy: Patient Spontanous Breathing  Post-op Assessment: Report given to RN and Post -op Vital signs reviewed and stable  Post vital signs: Reviewed and stable  Last Vitals:  Vitals Value Taken Time  BP 111/65 05/11/2018  8:15 AM  Temp    Pulse 77 05/11/2018  8:17 AM  Resp 17 05/11/2018  8:17 AM  SpO2 93 % 05/11/2018  8:17 AM  Vitals shown include unvalidated device data.  Last Pain:  Vitals:   05/11/18 0644  TempSrc: Oral  PainSc: 0-No pain         Complications: No apparent anesthesia complications

## 2018-05-11 NOTE — Anesthesia Preprocedure Evaluation (Addendum)
Anesthesia Evaluation  Patient identified by MRN, date of birth, ID band Patient awake    Reviewed: Allergy & Precautions, NPO status , Patient's Chart, lab work & pertinent test results  Airway Mallampati: II  TM Distance: >3 FB Neck ROM: Full    Dental  (+) Teeth Intact, Dental Advisory Given   Pulmonary Current Smoker,    Pulmonary exam normal breath sounds clear to auscultation       Cardiovascular Exercise Tolerance: Good negative cardio ROS Normal cardiovascular exam Rhythm:Regular Rate:Normal     Neuro/Psych PSYCHIATRIC DISORDERS Depression negative neurological ROS     GI/Hepatic Neg liver ROS, GERD  Medicated,tubulovillous adenoma, s/p surgical resection   Endo/Other  Obesity   Renal/GU negative Renal ROS     Musculoskeletal  (+) Arthritis ,   Abdominal   Peds  Hematology negative hematology ROS (+)   Anesthesia Other Findings Day of surgery medications reviewed with the patient.  Reproductive/Obstetrics                             Anesthesia Physical Anesthesia Plan  ASA: II  Anesthesia Plan: MAC   Post-op Pain Management:    Induction: Intravenous  PONV Risk Score and Plan: 0 and Treatment may vary due to age or medical condition  Airway Management Planned: Nasal Cannula and Natural Airway  Additional Equipment:   Intra-op Plan:   Post-operative Plan:   Informed Consent: I have reviewed the patients History and Physical, chart, labs and discussed the procedure including the risks, benefits and alternatives for the proposed anesthesia with the patient or authorized representative who has indicated his/her understanding and acceptance.   Dental advisory given  Plan Discussed with: CRNA and Anesthesiologist  Anesthesia Plan Comments: (Fentanyl, versed only.  Patient does not want propofol.)       Anesthesia Quick Evaluation

## 2018-05-11 NOTE — Op Note (Signed)
Preoperative diagnosis: 5 cm subcutaneous right buttock lipoma  Postoperative diagnosis: Same  Procedure: Excision of 5 cm right buttock lipoma subcutaneous  Surgeon: Erroll Luna, MD  Anesthesia: MAC with local  EBL: Minimal  Specimen: Lipoma to pathology  Drains: None  IV fluids: Per anesthesia record  Indications for procedure: The patient is a 57 year old male with a growing painful right buttock lipoma.  He presents today for excision.The procedure has been discussed with the patient.  Alternative therapies have been discussed with the patient.  Operative risks include bleeding,  Infection,  Organ injury,  Nerve injury,  Blood vessel injury,  DVT,  Pulmonary embolism,  Death,  And possible reoperation.  Medical management risks include worsening of present situation.  The success of the procedure is 50 -90 % at treating patients symptoms.  The patient understands and agrees to proceed.  Description of procedure: The patient was met in the holding area.  The lipoma was marked with the assistance of the patient in the holding area.  He was taken back to the operating room.  He was placed with his left side down exposing his right buttock.  MAC anesthesia was initiated.  The right buttock was prepped and draped in sterile fashion and timeout was done.  He received preoperative antibiotics.  0.25% Sensorcaine local with epinephrine was used.  The lipoma was identified and injected circumferentially.  Incision was made lipoma.  The entire lipoma was excised and subcutaneous fat measured 5 cm in maximal diameter.  Hemostasis achieved with cautery.  Wound closed with 3-0 Vicryl and 4-0 Monocryl.  Dermabond applied.  All final counts found to be correct.  The patient was then placed supine awoken taken to PACU in satisfactory condition.  All final counts were found to be correct.

## 2018-05-12 ENCOUNTER — Encounter (HOSPITAL_BASED_OUTPATIENT_CLINIC_OR_DEPARTMENT_OTHER): Payer: Self-pay | Admitting: Surgery

## 2018-06-22 ENCOUNTER — Encounter: Payer: Self-pay | Admitting: Family Medicine

## 2018-06-22 ENCOUNTER — Ambulatory Visit: Payer: 59 | Admitting: Family Medicine

## 2018-06-22 VITALS — BP 120/68 | HR 108 | Temp 98.5°F | Wt 217.2 lb

## 2018-06-22 DIAGNOSIS — M791 Myalgia, unspecified site: Secondary | ICD-10-CM | POA: Diagnosis not present

## 2018-06-22 LAB — HEPATIC FUNCTION PANEL
ALT: 26 U/L (ref 0–53)
AST: 19 U/L (ref 0–37)
Albumin: 4.5 g/dL (ref 3.5–5.2)
Alkaline Phosphatase: 77 U/L (ref 39–117)
Bilirubin, Direct: 0.1 mg/dL (ref 0.0–0.3)
Total Bilirubin: 0.3 mg/dL (ref 0.2–1.2)
Total Protein: 7 g/dL (ref 6.0–8.3)

## 2018-06-22 LAB — CK: Total CK: 142 U/L (ref 7–232)

## 2018-06-22 LAB — CBC WITH DIFFERENTIAL/PLATELET
Basophils Absolute: 0.1 10*3/uL (ref 0.0–0.1)
Basophils Relative: 0.8 % (ref 0.0–3.0)
EOS PCT: 1.6 % (ref 0.0–5.0)
Eosinophils Absolute: 0.1 10*3/uL (ref 0.0–0.7)
HEMATOCRIT: 49.5 % (ref 39.0–52.0)
Hemoglobin: 17 g/dL (ref 13.0–17.0)
Lymphocytes Relative: 23.2 % (ref 12.0–46.0)
Lymphs Abs: 2 10*3/uL (ref 0.7–4.0)
MCHC: 34.3 g/dL (ref 30.0–36.0)
MCV: 94.4 fl (ref 78.0–100.0)
Monocytes Absolute: 1 10*3/uL (ref 0.1–1.0)
Monocytes Relative: 11 % (ref 3.0–12.0)
Neutro Abs: 5.5 10*3/uL (ref 1.4–7.7)
Neutrophils Relative %: 63.4 % (ref 43.0–77.0)
Platelets: 361 10*3/uL (ref 150.0–400.0)
RBC: 5.24 Mil/uL (ref 4.22–5.81)
RDW: 13.2 % (ref 11.5–15.5)
WBC: 8.7 10*3/uL (ref 4.0–10.5)

## 2018-06-22 LAB — BASIC METABOLIC PANEL
BUN: 12 mg/dL (ref 6–23)
CO2: 31 mEq/L (ref 19–32)
Calcium: 10.1 mg/dL (ref 8.4–10.5)
Chloride: 100 mEq/L (ref 96–112)
Creatinine, Ser: 1.1 mg/dL (ref 0.40–1.50)
GFR: 72.92 mL/min (ref >60.00)
Glucose, Bld: 97 mg/dL (ref 70–99)
Potassium: 4.8 mEq/L (ref 3.5–5.1)
Sodium: 139 mEq/L (ref 135–145)

## 2018-06-22 LAB — POC URINALSYSI DIPSTICK (AUTOMATED)
Bilirubin, UA: NEGATIVE
Blood, UA: POSITIVE
GLUCOSE UA: NEGATIVE
Ketones, UA: NEGATIVE
Leukocytes, UA: NEGATIVE
Nitrite, UA: NEGATIVE
Protein, UA: NEGATIVE
Spec Grav, UA: >=1.03 — AB (ref 1.010–1.025)
Urobilinogen, UA: 0.2 E.U./dL
pH, UA: 5 (ref 5.0–8.0)

## 2018-06-22 LAB — TSH: TSH: 1.78 u[IU]/mL (ref 0.35–4.50)

## 2018-06-22 LAB — VITAMIN B12: Vitamin B-12: 235 pg/mL (ref 211–911)

## 2018-06-22 LAB — T4, FREE: Free T4: 0.85 ng/dL (ref 0.60–1.60)

## 2018-06-22 LAB — T3, FREE: T3, Free: 3.7 pg/mL (ref 2.3–4.2)

## 2018-06-22 NOTE — Progress Notes (Signed)
   Subjective:    Patient ID: Christopher Solis, male    DOB: September 24, 1960, 58 y.o.   MRN: [id number]  HPI Here for a variety of symptoms that stated about 2 weeks ago. These include intermittent fatigue, muscle weakness, headaches, nausea without vomiting, and trouble sleeping. No sinus congestion, no cough or SOB. No fevers. No abdominal symptoms. No recent medication changes.   Review of Systems  Constitutional: Positive for fatigue. Negative for fever.  HENT: Negative.   Eyes: Negative.   Respiratory: Negative.   Cardiovascular: Negative.   Gastrointestinal: Positive for nausea. Negative for abdominal distention, abdominal pain, blood in stool, constipation, diarrhea and vomiting.  Genitourinary: Negative.   Musculoskeletal: Positive for myalgias.  Neurological: Positive for weakness and headaches. Negative for dizziness.       Objective:   Physical Exam Constitutional:      General: He is not in acute distress.    Appearance: Normal appearance.  Neck:     Musculoskeletal: No neck rigidity.  Cardiovascular:     Rate and Rhythm: Normal rate and regular rhythm.     Pulses: Normal pulses.     Heart sounds: Normal heart sounds.  Pulmonary:     Effort: Pulmonary effort is normal.     Breath sounds: Normal breath sounds.  Abdominal:     General: Abdomen is flat. Bowel sounds are normal. There is no distension.     Palpations: Abdomen is soft. There is no mass.     Tenderness: There is no abdominal tenderness. There is no guarding or rebound.     Hernia: No hernia is present.  Musculoskeletal:        General: No swelling.  Lymphadenopathy:     Cervical: No cervical adenopathy.  Skin:    Findings: No rash.  Neurological:     General: No focal deficit present.     Mental Status: He is alert and oriented to person, place, and time.           Assessment & Plan:  Myalgias and other vague symptoms of uncertain etiology. We will get labs today including a CK. Recheck prn.    Alysia Penna, MD

## 2018-06-25 ENCOUNTER — Telehealth: Payer: Self-pay | Admitting: Family Medicine

## 2018-06-25 NOTE — Telephone Encounter (Signed)
Work note has been completed and given to the pt along with his results per Dr. Sarajane Jews.  He will stop up front and make an appointment for his B12 injections

## 2018-06-25 NOTE — Telephone Encounter (Signed)
Dr. Sarajane Jews please advise on the work note that the pt is requesting.  Thanks

## 2018-06-25 NOTE — Telephone Encounter (Signed)
Copied from [place] [id number]. Topic: General - Other >> Jun 25, 2018  9:54 AM Christopher Solis R wrote: Patient called in for lab results, and also stated he needs a doctors note for work he wasn't able to go in today because of lack of sleep

## 2018-06-25 NOTE — Telephone Encounter (Signed)
See my Result Note and yes please extend the work note as above

## 2018-06-28 ENCOUNTER — Ambulatory Visit (INDEPENDENT_AMBULATORY_CARE_PROVIDER_SITE_OTHER): Payer: 59 | Admitting: *Deleted

## 2018-06-28 DIAGNOSIS — E538 Deficiency of other specified B group vitamins: Secondary | ICD-10-CM

## 2018-06-28 MED ORDER — CYANOCOBALAMIN 1000 MCG/ML IJ SOLN
1000.0000 ug | Freq: Once | INTRAMUSCULAR | Status: AC
  Administered 2018-06-28: 1000 ug via INTRAMUSCULAR

## 2018-07-05 ENCOUNTER — Ambulatory Visit: Payer: 59

## 2018-07-06 ENCOUNTER — Ambulatory Visit (INDEPENDENT_AMBULATORY_CARE_PROVIDER_SITE_OTHER): Payer: 59 | Admitting: Adult Health

## 2018-07-06 DIAGNOSIS — E538 Deficiency of other specified B group vitamins: Secondary | ICD-10-CM | POA: Diagnosis not present

## 2018-07-06 MED ORDER — CYANOCOBALAMIN 1000 MCG/ML IJ SOLN
1000.0000 ug | Freq: Once | INTRAMUSCULAR | Status: AC
  Administered 2018-07-06: 1000 ug via INTRAMUSCULAR

## 2018-07-06 NOTE — Progress Notes (Signed)
Per orders of Dr. Fry, injection of B12 given by Alva Kuenzel. Patient tolerated injection well. 

## 2018-07-07 ENCOUNTER — Other Ambulatory Visit: Payer: Self-pay | Admitting: Family Medicine

## 2018-07-12 ENCOUNTER — Ambulatory Visit (INDEPENDENT_AMBULATORY_CARE_PROVIDER_SITE_OTHER): Payer: 59 | Admitting: *Deleted

## 2018-07-12 DIAGNOSIS — E538 Deficiency of other specified B group vitamins: Secondary | ICD-10-CM

## 2018-07-12 MED ORDER — CYANOCOBALAMIN 1000 MCG/ML IJ SOLN
1000.0000 ug | Freq: Once | INTRAMUSCULAR | Status: AC
  Administered 2018-07-12: 1000 ug via INTRAMUSCULAR

## 2018-07-12 NOTE — Progress Notes (Signed)
Per orders of Dr. Fry, injection of Vit B12 given by Addaline Peplinski M. Patient tolerated injection well.  

## 2018-07-13 NOTE — Progress Notes (Signed)
Agree with B12 injection as given. I was present in office during injection and available as needed  Eulas Post MD [facility]

## 2018-07-14 NOTE — Progress Notes (Addendum)
Christopher Solis, CMACertified Medical AssistantSigned 07/06/2018            Per orders of Dr. Sarajane Jews, injection of B12 given by Westley Hummer. Patient tolerated injection well.             Alysia Penna, MD I approved this injection. Alysia Penna, MD

## 2018-07-19 ENCOUNTER — Ambulatory Visit (INDEPENDENT_AMBULATORY_CARE_PROVIDER_SITE_OTHER): Payer: 59 | Admitting: *Deleted

## 2018-07-19 DIAGNOSIS — E538 Deficiency of other specified B group vitamins: Secondary | ICD-10-CM

## 2018-07-19 MED ORDER — CYANOCOBALAMIN 1000 MCG/ML IJ SOLN
1000.0000 ug | Freq: Once | INTRAMUSCULAR | Status: AC
  Administered 2018-07-19: 1000 ug via INTRAMUSCULAR

## 2018-07-26 ENCOUNTER — Ambulatory Visit (INDEPENDENT_AMBULATORY_CARE_PROVIDER_SITE_OTHER): Payer: 59 | Admitting: *Deleted

## 2018-07-26 DIAGNOSIS — E538 Deficiency of other specified B group vitamins: Secondary | ICD-10-CM

## 2018-07-26 MED ORDER — CYANOCOBALAMIN 1000 MCG/ML IJ SOLN
1000.0000 ug | Freq: Once | INTRAMUSCULAR | Status: AC
  Administered 2018-07-26: 1000 ug via INTRAMUSCULAR

## 2018-07-26 NOTE — Progress Notes (Signed)
Per orders of Dr. Fry, injection of Vit B12 given by Jerric Oyen M. Patient tolerated injection well.  

## 2018-08-02 ENCOUNTER — Ambulatory Visit (INDEPENDENT_AMBULATORY_CARE_PROVIDER_SITE_OTHER): Payer: 59 | Admitting: *Deleted

## 2018-08-02 DIAGNOSIS — E538 Deficiency of other specified B group vitamins: Secondary | ICD-10-CM

## 2018-08-02 MED ORDER — CYANOCOBALAMIN 1000 MCG/ML IJ SOLN
1000.0000 ug | Freq: Once | INTRAMUSCULAR | Status: AC
  Administered 2018-08-02: 1000 ug via INTRAMUSCULAR

## 2018-08-02 NOTE — Progress Notes (Signed)
Per orders of Dr. Fry, injection of Vit B12 given by Tija Biss M. Patient tolerated injection well.  

## 2018-08-03 ENCOUNTER — Ambulatory Visit: Payer: 59 | Admitting: Family Medicine

## 2018-08-03 ENCOUNTER — Encounter: Payer: Self-pay | Admitting: Family Medicine

## 2018-08-03 VITALS — BP 120/72 | HR 93 | Temp 98.4°F | Wt 211.1 lb

## 2018-08-03 DIAGNOSIS — J019 Acute sinusitis, unspecified: Secondary | ICD-10-CM | POA: Diagnosis not present

## 2018-08-03 MED ORDER — LEVOFLOXACIN 500 MG PO TABS: 500.0000 mg | ORAL_TABLET | Freq: Every day | ORAL | 0 refills | Status: AC

## 2018-08-04 ENCOUNTER — Encounter: Payer: Self-pay | Admitting: *Deleted

## 2018-08-04 ENCOUNTER — Encounter: Payer: Self-pay | Admitting: Family Medicine

## 2018-08-04 DIAGNOSIS — Z23 Encounter for immunization: Secondary | ICD-10-CM | POA: Diagnosis not present

## 2018-08-04 NOTE — Progress Notes (Signed)
   Subjective:    Patient ID: Christopher Solis, male    DOB: 14-Jul-1960, 58 y.o.   MRN: [id number]  HPI Here for 3 days of sinus pressure, PND, and a dry cough. No fever.    Review of Systems  Constitutional: Negative.   HENT: Positive for congestion, postnasal drip and sinus pressure. Negative for sinus pain and sore throat.   Eyes: Negative.   Respiratory: Positive for cough and choking.        Objective:   Physical Exam Constitutional:      Appearance: Normal appearance.  HENT:     Right Ear: Tympanic membrane and ear canal normal.     Left Ear: Tympanic membrane and ear canal normal.     Nose: Nose normal.     Mouth/Throat:     Pharynx: Oropharynx is clear.  Eyes:     Conjunctiva/sclera: Conjunctivae normal.  Pulmonary:     Effort: Pulmonary effort is normal.     Breath sounds: Normal breath sounds.  Lymphadenopathy:     Cervical: No cervical adenopathy.  Neurological:     Mental Status: He is alert.           Assessment & Plan:  Sinusitis, treat with Levaquin. Written out of work today. Alysia Penna, MD

## 2018-08-06 ENCOUNTER — Encounter: Payer: Self-pay | Admitting: Gastroenterology

## 2018-08-09 ENCOUNTER — Ambulatory Visit (INDEPENDENT_AMBULATORY_CARE_PROVIDER_SITE_OTHER): Payer: 59 | Admitting: *Deleted

## 2018-08-09 DIAGNOSIS — E538 Deficiency of other specified B group vitamins: Secondary | ICD-10-CM

## 2018-08-09 MED ORDER — CYANOCOBALAMIN 1000 MCG/ML IJ SOLN
1000.0000 ug | Freq: Once | INTRAMUSCULAR | Status: AC
  Administered 2018-08-09: 1000 ug via INTRAMUSCULAR

## 2018-08-09 NOTE — Progress Notes (Signed)
Per orders of Dr. Fry, injection of Vit B12 given by GREEN, ASHTYN M. Patient tolerated injection well.  

## 2018-08-16 ENCOUNTER — Ambulatory Visit (INDEPENDENT_AMBULATORY_CARE_PROVIDER_SITE_OTHER): Payer: 59 | Admitting: *Deleted

## 2018-08-16 DIAGNOSIS — E538 Deficiency of other specified B group vitamins: Secondary | ICD-10-CM | POA: Diagnosis not present

## 2018-08-16 MED ORDER — CYANOCOBALAMIN 1000 MCG/ML IJ SOLN
1000.0000 ug | Freq: Once | INTRAMUSCULAR | Status: AC
  Administered 2018-08-16: 1000 ug via INTRAMUSCULAR

## 2018-08-16 NOTE — Progress Notes (Signed)
Per orders of Dr. Fry, injection of Vit B12 given by Brown Dunlap M. Patient tolerated injection well.  

## 2018-08-23 ENCOUNTER — Ambulatory Visit: Payer: 59

## 2018-08-30 ENCOUNTER — Ambulatory Visit: Payer: 59

## 2018-08-30 ENCOUNTER — Telehealth: Payer: Self-pay | Admitting: *Deleted

## 2018-08-30 MED ORDER — B-12 1000 MCG SL SUBL: 1.0000 | SUBLINGUAL_TABLET | Freq: Every day | SUBLINGUAL | 5 refills | Status: DC

## 2018-08-30 NOTE — Telephone Encounter (Signed)
Switch to SL B12 tablets daily OTC

## 2018-08-30 NOTE — Telephone Encounter (Signed)
RN spoke with patient. Explained to patient d/t COVID 31 outbreak we are limiting OV and can switch patient to another method of B12. Patient verbalized understanding. Please send new rx for B12 to preferred pharmacy.

## 2018-08-30 NOTE — Telephone Encounter (Signed)
Called and spoke with the pt and he is aware of rx sent to the pharmacy.

## 2018-08-30 NOTE — Telephone Encounter (Signed)
Dr. Fry please advise. Thanks  

## 2018-09-06 ENCOUNTER — Ambulatory Visit: Payer: 59

## 2018-09-13 ENCOUNTER — Ambulatory Visit: Payer: 59

## 2018-12-24 ENCOUNTER — Other Ambulatory Visit: Payer: Self-pay | Admitting: Family Medicine

## 2018-12-29 NOTE — Telephone Encounter (Signed)
Done

## 2019-04-12 ENCOUNTER — Other Ambulatory Visit: Payer: Self-pay

## 2019-04-12 ENCOUNTER — Encounter: Payer: Self-pay | Admitting: Family Medicine

## 2019-04-12 ENCOUNTER — Telehealth (INDEPENDENT_AMBULATORY_CARE_PROVIDER_SITE_OTHER): Payer: 59 | Admitting: Family Medicine

## 2019-04-12 DIAGNOSIS — G4700 Insomnia, unspecified: Secondary | ICD-10-CM | POA: Insufficient documentation

## 2019-04-12 DIAGNOSIS — R6889 Other general symptoms and signs: Secondary | ICD-10-CM

## 2019-04-12 DIAGNOSIS — M79674 Pain in right toe(s): Secondary | ICD-10-CM | POA: Insufficient documentation

## 2019-04-12 DIAGNOSIS — Z86010 Personal history of colonic polyps: Secondary | ICD-10-CM

## 2019-04-12 DIAGNOSIS — Z20822 Contact with and (suspected) exposure to covid-19: Secondary | ICD-10-CM

## 2019-04-12 MED ORDER — TEMAZEPAM 30 MG PO CAPS: 30.0000 mg | ORAL_CAPSULE | Freq: Every evening | ORAL | 2 refills | Status: DC | PRN

## 2019-04-12 NOTE — Progress Notes (Signed)
Virtual Visit via Video Note  I connected with the patient on 04/12/19 at 10:45 AM EST by a video enabled telemedicine application and verified that I am speaking with the correct person using two identifiers.  Location patient: home Location provider:work or home office Persons participating in the virtual visit: patient, provider  I discussed the limitations of evaluation and management by telemedicine and the availability of in person appointments. The patient expressed understanding and agreed to proceed.   HPI: Here to discuss several issues. First he has had stiffness and pain in the right great toe for several months and medications no longer help. He manges to work on it during the day but it throbs at night. Second he has trouble sleeping, both falling and staying asleep. He wants to try something for this. Third he was due for a colonoscopy in January of this year but he did not hear from the GI office. Last he says he has had a low grade fever up to 100.2 degrees for the past 2 days along with some mild fatigue and mild body aches. No headache or ST or cough or SOB or NVD. He notes a coworker had tested positive for the Covid-19 virus last week.    ROS: See pertinent positives and negatives per HPI.  Past Medical History:  Diagnosis Date  . Allergy   . Colon polyps    adenomatous  . Depression   . Low testosterone   . Osteoarthritis   . Rectal mass    tubulovillous adenoma, s/p surgical resection    Past Surgical History:  Procedure Laterality Date  . COLON SURGERY  07/20/2013   removal of tubulovillous polyp in rectosigmoid colon at [facility]   . COLONOSCOPY  06/14/2015   per Dr. Havery Moros, adenomatous polyps, repeat in 3 years   . ELBOW SURGERY Right    reconstructive / right elbow  . MASS EXCISION Right 05/11/2018   Procedure: EXCISION RIGHT  BUTTOCK   MASS;  Surgeon: Erroll Luna, MD;  Location: [place];  Service: General;  Laterality: Right;   . TONSILLECTOMY    . VASECTOMY    . WISDOM TOOTH EXTRACTION      Family History  Problem Relation Age of Onset  . Arthritis Mother   . Coronary artery disease Mother   . Depression Mother   . Hypertension Mother   . Colon polyps Mother   . Colon polyps Father   . Hypertension Brother   . Heart disease Brother   . Hyperlipidemia Brother   . Colon cancer Other        maternal great grandmother  . Stomach cancer Paternal Grandmother      Current Outpatient Medications:  .  albuterol (PROVENTIL HFA;VENTOLIN HFA) 108 (90 Base) MCG/ACT inhaler, Inhale 2 puffs into the lungs every 4 (four) hours as needed for wheezing or shortness of breath., Disp: 1 Inhaler, Rfl: 5 .  Cyanocobalamin (B-12) 1000 MCG SUBL, Place 1 tablet under the tongue daily., Disp: 30 each, Rfl: 5 .  cyclobenzaprine (FLEXERIL) 10 MG tablet, Take 1 tablet by mouth three times daily as needed for muscle spasm, Disp: 90 tablet, Rfl: 5 .  ibuprofen (ADVIL,MOTRIN) 800 MG tablet, Take 1 tablet (800 mg total) by mouth every 8 (eight) hours as needed., Disp: 30 tablet, Rfl: 0 .  loratadine (CLARITIN) 10 MG tablet, Take 10 mg by mouth daily., Disp: , Rfl:  .  methocarbamol (ROBAXIN-750) 750 MG tablet, Take 1 tablet (750 mg total) by  mouth 4 (four) times daily., Disp: 60 tablet, Rfl: 2 .  montelukast (SINGULAIR) 10 MG tablet, TAKE 1 TABLET BY MOUTH AT BEDTIME, Disp: 30 tablet, Rfl: 6 .  omeprazole (PRILOSEC) 40 MG capsule, TAKE 1 CAPSULE BY MOUTH ONCE DAILY, Disp: 30 capsule, Rfl: 11 .  ondansetron (ZOFRAN) 8 MG tablet, TAKE ONE TABLET BY MOUTH EVERY 6 HOURS AS NEEDED FOR NAUSEA AND FOR VOMITING, Disp: 30 tablet, Rfl: 2 .  oxyCODONE (OXY IR/ROXICODONE) 5 MG immediate release tablet, Take 1 tablet (5 mg total) by mouth every 6 (six) hours as needed for severe pain., Disp: 4 tablet, Rfl: 0 .  sildenafil (VIAGRA) 100 MG tablet, TAKE ONE TABLET BY MOUTH ONCE DAILY AS NEEDED FOR  ERECTILE  DYSFUNCTION, Disp: 4 tablet, Rfl:  2  EXAM:  VITALS per patient if applicable:  GENERAL: alert, oriented, appears well and in no acute distress  HEENT: atraumatic, conjunttiva clear, no obvious abnormalities on inspection of external nose and ears  NECK: normal movements of the head and neck  LUNGS: on inspection no signs of respiratory distress, breathing rate appears normal, no obvious gross SOB, gasping or wheezing  CV: no obvious cyanosis  MS: moves all visible extremities without noticeable abnormality  PSYCH/NEURO: pleasant and cooperative, no obvious depression or anxiety, speech and thought processing grossly intact  ASSESSMENT AND PLAN: For the painful toe, we will refer him to Podiatry. For the insomnia he will try Temazepam. We will contact the GI office and have them set up another colonoscopy. Finally he has flu-like symptoms and there is a good chance he has a Covid infection. He will quarantine himself at home for the next 10 days, and he will go get tested for the virus today. Drink fluids and take Tylenol prn.  Alysia Penna, MD  Discussed the following assessment and plan:  No diagnosis found.     I discussed the assessment and treatment plan with the patient. The patient was provided an opportunity to ask questions and all were answered. The patient agreed with the plan and demonstrated an understanding of the instructions.   The patient was advised to call back or seek an in-person evaluation if the symptoms worsen or if the condition fails to improve as anticipated.

## 2019-04-13 ENCOUNTER — Encounter: Payer: Self-pay | Admitting: Gastroenterology

## 2019-04-14 LAB — NOVEL CORONAVIRUS, NAA: SARS-CoV-2, NAA: NOT DETECTED

## 2019-04-22 ENCOUNTER — Ambulatory Visit: Payer: Self-pay | Admitting: Podiatry

## 2019-05-13 ENCOUNTER — Telehealth: Payer: Self-pay | Admitting: *Deleted

## 2019-05-13 DIAGNOSIS — K6289 Other specified diseases of anus and rectum: Secondary | ICD-10-CM | POA: Insufficient documentation

## 2019-05-13 NOTE — Telephone Encounter (Signed)
Pt had a 430 pm PV today- No show at 430 pm- Called pt at 444 pm , no answer, LM to Return call by 5 pm today to RS PV- LM that if  no call by 5 pm , his colon 12-18 would also be canceled- No return call to RS by 5 pm  Mailed NS letter Canceled PV and Colon 05-27-2019

## 2019-05-20 ENCOUNTER — Ambulatory Visit: Payer: Self-pay | Admitting: Podiatry

## 2019-05-27 ENCOUNTER — Encounter: Payer: Self-pay | Admitting: Gastroenterology

## 2019-05-28 ENCOUNTER — Other Ambulatory Visit: Payer: Self-pay | Admitting: Family Medicine

## 2019-06-20 ENCOUNTER — Other Ambulatory Visit: Payer: Self-pay

## 2019-06-20 ENCOUNTER — Telehealth (INDEPENDENT_AMBULATORY_CARE_PROVIDER_SITE_OTHER): Payer: Self-pay | Admitting: Family Medicine

## 2019-06-20 DIAGNOSIS — G4700 Insomnia, unspecified: Secondary | ICD-10-CM

## 2019-06-20 MED ORDER — ZOLPIDEM TARTRATE 10 MG PO TABS: 10.0000 mg | ORAL_TABLET | Freq: Every evening | ORAL | 1 refills | Status: DC | PRN

## 2019-06-20 NOTE — Progress Notes (Signed)
Virtual Visit via Video Note  I connected with the patient on 06/20/19 at  3:30 PM EST by a video enabled telemedicine application and verified that I am speaking with the correct person using two identifiers.  Location patient: home Location provider:work or home office Persons participating in the virtual visit: patient, provider  I discussed the limitations of evaluation and management by telemedicine and the availability of in person appointments. The patient expressed understanding and agreed to proceed.   HPI: Her for sleep issues. He has been taking Temazepam 30 mg at bedtime and it worked well for a time. However now he is getting only 2-3 hours of sleep every night. No other medication changes. He feels well other than having some fatigue.    ROS: See pertinent positives and negatives per HPI.  Past Medical History:  Diagnosis Date  . Allergy   . Colon polyps    adenomatous  . Depression   . Low testosterone   . Osteoarthritis   . Rectal mass    tubulovillous adenoma, s/p surgical resection    Past Surgical History:  Procedure Laterality Date  . COLON SURGERY  07/20/2013   removal of tubulovillous polyp in rectosigmoid colon at [facility]   . COLONOSCOPY  06/14/2015   per Dr. Havery Moros, adenomatous polyps, repeat in 3 years   . ELBOW SURGERY Right    reconstructive / right elbow  . MASS EXCISION Right 05/11/2018   Procedure: EXCISION RIGHT  BUTTOCK   MASS;  Surgeon: Erroll Luna, MD;  Location: [place];  Service: General;  Laterality: Right;  . TONSILLECTOMY    . VASECTOMY    . WISDOM TOOTH EXTRACTION      Family History  Problem Relation Age of Onset  . Arthritis Mother   . Coronary artery disease Mother   . Depression Mother   . Hypertension Mother   . Colon polyps Mother   . Colon polyps Father   . Hypertension Brother   . Heart disease Brother   . Hyperlipidemia Brother   . Colon cancer Other        maternal great grandmother   . Stomach cancer Paternal Grandmother      Current Outpatient Medications:  .  albuterol (PROVENTIL HFA;VENTOLIN HFA) 108 (90 Base) MCG/ACT inhaler, Inhale 2 puffs into the lungs every 4 (four) hours as needed for wheezing or shortness of breath., Disp: 1 Inhaler, Rfl: 5 .  Cyanocobalamin (B-12) 1000 MCG SUBL, Place 1 tablet under the tongue daily., Disp: 30 each, Rfl: 5 .  cyclobenzaprine (FLEXERIL) 10 MG tablet, Take 1 tablet by mouth three times daily as needed for muscle spasm, Disp: 90 tablet, Rfl: 5 .  ibuprofen (ADVIL,MOTRIN) 800 MG tablet, Take 1 tablet (800 mg total) by mouth every 8 (eight) hours as needed., Disp: 30 tablet, Rfl: 0 .  loratadine (CLARITIN) 10 MG tablet, Take 10 mg by mouth daily., Disp: , Rfl:  .  methocarbamol (ROBAXIN-750) 750 MG tablet, Take 1 tablet (750 mg total) by mouth 4 (four) times daily., Disp: 60 tablet, Rfl: 2 .  montelukast (SINGULAIR) 10 MG tablet, TAKE 1 TABLET BY MOUTH AT BEDTIME, Disp: 30 tablet, Rfl: 0 .  omeprazole (PRILOSEC) 40 MG capsule, TAKE 1 CAPSULE BY MOUTH ONCE DAILY, Disp: 30 capsule, Rfl: 11 .  ondansetron (ZOFRAN) 8 MG tablet, TAKE ONE TABLET BY MOUTH EVERY 6 HOURS AS NEEDED FOR NAUSEA AND FOR VOMITING, Disp: 30 tablet, Rfl: 2 .  oxyCODONE (OXY IR/ROXICODONE) 5 MG immediate  release tablet, Take 1 tablet (5 mg total) by mouth every 6 (six) hours as needed for severe pain., Disp: 4 tablet, Rfl: 0 .  sildenafil (VIAGRA) 100 MG tablet, TAKE ONE TABLET BY MOUTH ONCE DAILY AS NEEDED FOR  ERECTILE  DYSFUNCTION, Disp: 4 tablet, Rfl: 2 .  zolpidem (AMBIEN) 10 MG tablet, Take 1 tablet (10 mg total) by mouth at bedtime as needed for sleep., Disp: 30 tablet, Rfl: 1  EXAM:  VITALS per patient if applicable:  GENERAL: alert, oriented, appears well and in no acute distress  HEENT: atraumatic, conjunttiva clear, no obvious abnormalities on inspection of external nose and ears  NECK: normal movements of the head and neck  LUNGS: on inspection no  signs of respiratory distress, breathing rate appears normal, no obvious gross SOB, gasping or wheezing  CV: no obvious cyanosis  MS: moves all visible extremities without noticeable abnormality  PSYCH/NEURO: pleasant and cooperative, no obvious depression or anxiety, speech and thought processing grossly intact  ASSESSMENT AND PLAN: Insomnia. Stop Temazepam and tyr Zolpidem 10 mg qhs.  Alysia Penna, MD  Discussed the following assessment and plan:  No diagnosis found.     I discussed the assessment and treatment plan with the patient. The patient was provided an opportunity to ask questions and all were answered. The patient agreed with the plan and demonstrated an understanding of the instructions.   The patient was advised to call back or seek an in-person evaluation if the symptoms worsen or if the condition fails to improve as anticipated.

## 2019-06-23 ENCOUNTER — Ambulatory Visit: Payer: Self-pay | Admitting: Podiatry

## 2019-06-30 ENCOUNTER — Other Ambulatory Visit: Payer: Self-pay | Admitting: Family Medicine

## 2019-07-15 ENCOUNTER — Other Ambulatory Visit: Payer: Self-pay | Admitting: Podiatry

## 2019-07-15 ENCOUNTER — Ambulatory Visit: Payer: BC Managed Care – PPO | Admitting: Podiatry

## 2019-07-15 ENCOUNTER — Ambulatory Visit (INDEPENDENT_AMBULATORY_CARE_PROVIDER_SITE_OTHER): Payer: BC Managed Care – PPO

## 2019-07-15 ENCOUNTER — Other Ambulatory Visit: Payer: Self-pay

## 2019-07-15 DIAGNOSIS — M778 Other enthesopathies, not elsewhere classified: Secondary | ICD-10-CM

## 2019-07-15 DIAGNOSIS — M79671 Pain in right foot: Secondary | ICD-10-CM

## 2019-07-15 DIAGNOSIS — M19079 Primary osteoarthritis, unspecified ankle and foot: Secondary | ICD-10-CM

## 2019-07-18 ENCOUNTER — Encounter: Payer: Self-pay | Admitting: Podiatry

## 2019-07-18 NOTE — Progress Notes (Signed)
Subjective:  Patient ID: Christopher Solis, male    DOB: Nov 21, 1960,  MRN: [id number]  Chief Complaint  Patient presents with  . Toe Pain    pt is here for right big toe pain, pain is elevated to the touch, pt also states that pain is elevated when he is at work, pt has tried icing it as well as a heat pad but not much has helped it.    59 y.o. male presents with the above complaint.  Patient states he presents with right great toe pain.  Patient states been going on for 9 months.  Patient's job requires him to wear steel toe shoes which makes it progressively harder.  Patient states is between the first and second toe painful when walking on it.  Patient states this could be due to the steel toe shoes.  He has tried icing and heating but has not helped much.  He has tried taking ibuprofen which has not helped.  He would like to know if there is something else that could be done.  He denies any other acute complaints.   Review of Systems: Negative except as noted in the HPI. Denies N/V/F/Ch.  Past Medical History:  Diagnosis Date  . Allergy   . Colon polyps    adenomatous  . Depression   . Low testosterone   . Osteoarthritis   . Rectal mass    tubulovillous adenoma, s/p surgical resection    Current Outpatient Medications:  .  albuterol (PROVENTIL HFA;VENTOLIN HFA) 108 (90 Base) MCG/ACT inhaler, Inhale 2 puffs into the lungs every 4 (four) hours as needed for wheezing or shortness of breath., Disp: 1 Inhaler, Rfl: 5 .  Cyanocobalamin (VITAMIN B-12) 1000 MCG SUBL, PLACE 1  UNDER THE TONGUE ONCE DAILY, Disp: 30 tablet, Rfl: 0 .  cyclobenzaprine (FLEXERIL) 10 MG tablet, Take 1 tablet by mouth three times daily as needed for muscle spasm, Disp: 90 tablet, Rfl: 0 .  ibuprofen (ADVIL,MOTRIN) 800 MG tablet, Take 1 tablet (800 mg total) by mouth every 8 (eight) hours as needed., Disp: 30 tablet, Rfl: 0 .  loratadine (CLARITIN) 10 MG tablet, Take 10 mg by mouth daily., Disp: , Rfl:  .   methocarbamol (ROBAXIN-750) 750 MG tablet, Take 1 tablet (750 mg total) by mouth 4 (four) times daily., Disp: 60 tablet, Rfl: 2 .  montelukast (SINGULAIR) 10 MG tablet, TAKE 1 TABLET BY MOUTH AT BEDTIME, Disp: 30 tablet, Rfl: 0 .  omeprazole (PRILOSEC) 40 MG capsule, Take 1 capsule by mouth once daily, Disp: 30 capsule, Rfl: 0 .  ondansetron (ZOFRAN) 8 MG tablet, TAKE ONE TABLET BY MOUTH EVERY 6 HOURS AS NEEDED FOR NAUSEA AND FOR VOMITING, Disp: 30 tablet, Rfl: 2 .  oxyCODONE (OXY IR/ROXICODONE) 5 MG immediate release tablet, Take 1 tablet (5 mg total) by mouth every 6 (six) hours as needed for severe pain., Disp: 4 tablet, Rfl: 0 .  sildenafil (VIAGRA) 100 MG tablet, TAKE ONE TABLET BY MOUTH ONCE DAILY AS NEEDED FOR  ERECTILE  DYSFUNCTION, Disp: 4 tablet, Rfl: 2 .  zolpidem (AMBIEN) 10 MG tablet, Take 1 tablet (10 mg total) by mouth at bedtime as needed for sleep., Disp: 30 tablet, Rfl: 1  Social History   Tobacco Use  Smoking Status Current Every Day Smoker  . Packs/day: 1.50  . Years: 37.00  . Pack years: 55.50  . Types: Cigarettes  Smokeless Tobacco Former Systems developer  . Quit date: 06/22/1980    Allergies  Allergen Reactions  .  Morphine Nausea And Vomiting  . Amoxicillin     nausea  . Propoxyphene N-Acetaminophen     Causes migraine, Darvocet N   Objective:  There were no vitals filed for this visit. There is no height or weight on file to calculate BMI. Constitutional Well developed. Well nourished.  Vascular Dorsalis pedis pulses palpable bilaterally. Posterior tibial pulses palpable bilaterally. Capillary refill normal to all digits.  No cyanosis or clubbing noted. Pedal hair growth normal.  Neurologic Normal speech. Oriented to person, place, and time. Epicritic sensation to light touch grossly present bilaterally.  Dermatologic Nails well groomed and normal in appearance. No open wounds. No skin lesions.  Orthopedic:  Pain on palpation to the right lateral portion of the  first metatarsophalangeal joint.  Mild pain with range of motion of the first metatarsophalangeal joint.  Intra-articular crepitus noted.  Limited range of motion of the first metatarsophalangeal joint noted.   Radiographs: 3 views of skeletally mature adult foot.  Decrease in the first metatarsophalangeal joint space noted.  Arthritic changes noted at the first metatarsophalangeal joint.  Mild first metatarsal elevatus noted.  No osteophytes noted.  Sesamoid positions are intact.  Incidental finding of heel spur noted.  Assessment:   1. Foot pain, right    Plan:  Patient was evaluated and treated and all questions answered.  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  I explained to the patient all the treatment options including surgical was discussed.  I believe patient will benefit from a steroid injection as this can decrease the inflammatory component of the first metatarsophalangeal joint.  Patient agrees with the plan and would like to proceed with injection.  I spoke with.  Patient about shoe gear modification as well. -A steroid injection was performed at right first metatarsophalangeal joint using 1% plain Lidocaine and 10 mg of Kenalog. This was well tolerated. -I will see the patient back again in 4 weeks if the injection has helped I may perform another injection versus consider possible surgical intervention.  I believe patient will also benefit from orthotics management which I will discuss at next visit.   Return in about 4 weeks (around 08/12/2019).

## 2019-08-12 ENCOUNTER — Ambulatory Visit: Payer: BC Managed Care – PPO | Admitting: Podiatry

## 2019-08-13 ENCOUNTER — Other Ambulatory Visit: Payer: Self-pay | Admitting: Family Medicine

## 2019-08-19 ENCOUNTER — Other Ambulatory Visit: Payer: Self-pay

## 2019-08-19 ENCOUNTER — Ambulatory Visit: Payer: BC Managed Care – PPO | Admitting: Podiatry

## 2019-08-19 DIAGNOSIS — M79671 Pain in right foot: Secondary | ICD-10-CM | POA: Diagnosis not present

## 2019-08-19 DIAGNOSIS — M19072 Primary osteoarthritis, left ankle and foot: Secondary | ICD-10-CM

## 2019-08-19 DIAGNOSIS — M79672 Pain in left foot: Secondary | ICD-10-CM | POA: Diagnosis not present

## 2019-08-19 DIAGNOSIS — M19079 Primary osteoarthritis, unspecified ankle and foot: Secondary | ICD-10-CM

## 2019-08-23 ENCOUNTER — Encounter: Payer: Self-pay | Admitting: Podiatry

## 2019-08-23 NOTE — Progress Notes (Signed)
Subjective:  Patient ID: Christopher Solis, male    DOB: 1960-11-05,  MRN: [id number]  Chief Complaint  Patient presents with  . Toe Pain    pt is here for a f/u on right 1st toe pain, pt states that the injection wore off 3 weeks after recieving the injection, pt puts pain scale as a 8 out of 10 on the pain scale. Pt also states that the pain radiated to the left big toe as well.    59 y.o. male presents with the above complaint.  Patient presents with a follow-up of bilateral big toe joint arthritis.  Patient states the injection that was given last time did help him for about 3 to 4 weeks.  He states that the injection wore off and the pain is back.  Pain scale is 8 out of 10.  He is also waiting on getting orthotics.  He denies any other acute complaints.  He would like another steroid injection to see if this could really decrease most of the pain away.  Given that he did have greater than 50% improvement during the first injection.   Review of Systems: Negative except as noted in the HPI. Denies N/V/F/Ch.  Past Medical History:  Diagnosis Date  . Allergy   . Colon polyps    adenomatous  . Depression   . Low testosterone   . Osteoarthritis   . Rectal mass    tubulovillous adenoma, s/p surgical resection    Current Outpatient Medications:  .  albuterol (PROVENTIL HFA;VENTOLIN HFA) 108 (90 Base) MCG/ACT inhaler, Inhale 2 puffs into the lungs every 4 (four) hours as needed for wheezing or shortness of breath., Disp: 1 Inhaler, Rfl: 5 .  Cyanocobalamin (VITAMIN B-12) 1000 MCG SUBL, PLACE 1  UNDER THE TONGUE ONCE DAILY, Disp: 30 tablet, Rfl: 0 .  cyclobenzaprine (FLEXERIL) 10 MG tablet, Take 1 tablet by mouth three times daily as needed for muscle spasm, Disp: 90 tablet, Rfl: 0 .  ibuprofen (ADVIL,MOTRIN) 800 MG tablet, Take 1 tablet (800 mg total) by mouth every 8 (eight) hours as needed., Disp: 30 tablet, Rfl: 0 .  loratadine (CLARITIN) 10 MG tablet, Take 10 mg by mouth daily., Disp: ,  Rfl:  .  methocarbamol (ROBAXIN-750) 750 MG tablet, Take 1 tablet (750 mg total) by mouth 4 (four) times daily., Disp: 60 tablet, Rfl: 2 .  montelukast (SINGULAIR) 10 MG tablet, TAKE 1 TABLET BY MOUTH AT BEDTIME, Disp: 30 tablet, Rfl: 0 .  omeprazole (PRILOSEC) 40 MG capsule, Take 1 capsule by mouth once daily, Disp: 30 capsule, Rfl: 0 .  ondansetron (ZOFRAN) 8 MG tablet, TAKE ONE TABLET BY MOUTH EVERY 6 HOURS AS NEEDED FOR NAUSEA AND FOR VOMITING, Disp: 30 tablet, Rfl: 2 .  oxyCODONE (OXY IR/ROXICODONE) 5 MG immediate release tablet, Take 1 tablet (5 mg total) by mouth every 6 (six) hours as needed for severe pain., Disp: 4 tablet, Rfl: 0 .  sildenafil (VIAGRA) 100 MG tablet, TAKE ONE TABLET BY MOUTH ONCE DAILY AS NEEDED FOR  ERECTILE  DYSFUNCTION, Disp: 4 tablet, Rfl: 2 .  zolpidem (AMBIEN) 10 MG tablet, Take 1 tablet (10 mg total) by mouth at bedtime as needed for sleep., Disp: 30 tablet, Rfl: 1  Social History   Tobacco Use  Smoking Status Current Every Day Smoker  . Packs/day: 1.50  . Years: 37.00  . Pack years: 55.50  . Types: Cigarettes  Smokeless Tobacco Former Systems developer  . Quit date: 06/22/1980    Allergies  Allergen Reactions  . Morphine Nausea And Vomiting  . Amoxicillin     nausea  . Propoxyphene N-Acetaminophen     Causes migraine, Darvocet N   Objective:  There were no vitals filed for this visit. There is no height or weight on file to calculate BMI. Constitutional Well developed. Well nourished.  Vascular Dorsalis pedis pulses palpable bilaterally. Posterior tibial pulses palpable bilaterally. Capillary refill normal to all digits.  No cyanosis or clubbing noted. Pedal hair growth normal.  Neurologic Normal speech. Oriented to person, place, and time. Epicritic sensation to light touch grossly present bilaterally.  Dermatologic Nails well groomed and normal in appearance. No open wounds. No skin lesions.  Orthopedic:  Pain on palpation to the right lateral  portion of the first metatarsophalangeal joint.  Mild pain with range of motion of the first metatarsophalangeal joint.  Intra-articular crepitus noted.  Limited range of motion of the first metatarsophalangeal joint noted.   Radiographs: 3 views of skeletally mature adult foot.  Decrease in the first metatarsophalangeal joint space noted.  Arthritic changes noted at the first metatarsophalangeal joint.  Mild first metatarsal elevatus noted.  No osteophytes noted.  Sesamoid positions are intact.  Incidental finding of heel spur noted.  Assessment:   1. Arthritis of first metatarsophalangeal joint   2. Foot pain, right   3. Arthritis of first metatarsophalangeal (MTP) joint of left foot   4. Left foot pain    Plan:  Patient was evaluated and treated and all questions answered.  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  I explained to the patient all the treatment options including surgical was discussed.  I believe patient will benefit from a steroid injection as this can decrease the inflammatory component of the first metatarsophalangeal joint.  Patient agrees with the plan and would like to proceed with injection.  I spoke with.  Patient about shoe gear modification as well. -A steroid injection was performed at bilateral first metatarsophalangeal joint using 1% plain Lidocaine and 10 mg of Kenalog. This was well tolerated. -I will see the patient back again in 4 weeks if the injection has helped I may perform another injection versus consider possible surgical intervention.  I believe patient will also benefit from orthotics management which I will discuss at next visit.   Return in about 4 weeks (around 09/16/2019).

## 2019-09-01 ENCOUNTER — Encounter: Payer: Self-pay | Admitting: Family Medicine

## 2019-09-01 ENCOUNTER — Other Ambulatory Visit: Payer: Self-pay

## 2019-09-01 ENCOUNTER — Telehealth: Payer: Self-pay | Admitting: Family Medicine

## 2019-09-01 ENCOUNTER — Ambulatory Visit: Payer: BC Managed Care – PPO | Admitting: Family Medicine

## 2019-09-01 VITALS — BP 140/80 | HR 109 | Temp 97.9°F | Ht 69.5 in | Wt 212.0 lb

## 2019-09-01 DIAGNOSIS — S39012A Strain of muscle, fascia and tendon of lower back, initial encounter: Secondary | ICD-10-CM | POA: Diagnosis not present

## 2019-09-01 MED ORDER — HYDROCODONE-ACETAMINOPHEN 10-325 MG PO TABS: 1.0000 | ORAL_TABLET | Freq: Four times a day (QID) | ORAL | 0 refills | Status: AC | PRN

## 2019-09-01 MED ORDER — HYDROCODONE-ACETAMINOPHEN 10-325 MG PO TABS: 1.0000 | ORAL_TABLET | ORAL | 0 refills | Status: DC | PRN

## 2019-09-01 MED ORDER — METHYLPREDNISOLONE 4 MG PO TBPK: ORAL_TABLET | ORAL | 0 refills | Status: DC

## 2019-09-01 NOTE — Progress Notes (Signed)
   Subjective:    Patient ID: Christopher Solis, male    DOB: 12/22/1960, 59 y.o.   MRN: [id number]  HPI Here for low back pain. One week ago while working in his yard, he bent over to pick up a trash can and felt a sudden sharp pain in the center of the lower back. No radiation to the legs. He has been applying ice and heat, and he is taking Ibuprofen and Tylenol and Flexeril.    Review of Systems  Constitutional: Negative.   Respiratory: Negative.   Cardiovascular: Negative.   Musculoskeletal: Positive for back pain.  Neurological: Negative for weakness and numbness.       Objective:   Physical Exam Constitutional:      Comments: He walks stiffly   Cardiovascular:     Rate and Rhythm: Normal rate and regular rhythm.     Pulses: Normal pulses.     Heart sounds: Normal heart sounds.  Pulmonary:     Effort: Pulmonary effort is normal.     Breath sounds: Normal breath sounds.  Musculoskeletal:     Comments: He is quite tender over the lower spine. ROM is limited by pain. SLR are negative   Neurological:     Mental Status: He is alert.           Assessment & Plan:  Lumbar strain. Written out of work from 08-26-58 until 09-05-58. Use Norco for pain. He will also take a Medrol dose pack to reduce inflammation. Recheck prn. Alysia Penna, MD

## 2019-09-01 NOTE — Telephone Encounter (Signed)
Noted! Patient notified of update  and verbalized understanding.

## 2019-09-01 NOTE — Telephone Encounter (Signed)
Please advise 

## 2019-09-01 NOTE — Telephone Encounter (Signed)
Pharmacist states that the prescription for Hydrocodone 10-325 mg can not be refilled for the way it is written. They are following CDC guidelines and this is past the limit they can give. It will either have to be rewritten to 5 per day or refilled somewhere else.   Phone: [phone]

## 2019-09-01 NOTE — Telephone Encounter (Signed)
I resent it for #20 instead of 30

## 2019-09-10 ENCOUNTER — Other Ambulatory Visit: Payer: Self-pay | Admitting: Family Medicine

## 2019-09-13 ENCOUNTER — Other Ambulatory Visit: Payer: Self-pay | Admitting: Family Medicine

## 2019-09-14 NOTE — Telephone Encounter (Signed)
Ambien last filled 06/20/2019 Last OV 09/01/2019  Ok to fill?

## 2019-09-16 ENCOUNTER — Ambulatory Visit: Payer: BC Managed Care – PPO | Admitting: Podiatry

## 2020-02-02 ENCOUNTER — Other Ambulatory Visit: Payer: Self-pay | Admitting: Family Medicine

## 2020-03-14 ENCOUNTER — Other Ambulatory Visit: Payer: Self-pay | Admitting: Family Medicine

## 2020-03-15 NOTE — Telephone Encounter (Signed)
Please advise on refill request for sildenafil.   Last appt with PCP was March of 2021

## 2020-04-18 ENCOUNTER — Other Ambulatory Visit: Payer: Self-pay | Admitting: Family Medicine

## 2020-05-25 ENCOUNTER — Other Ambulatory Visit: Payer: Self-pay | Admitting: Family Medicine

## 2020-06-26 ENCOUNTER — Other Ambulatory Visit: Payer: Self-pay | Admitting: Family Medicine

## 2020-07-18 ENCOUNTER — Encounter: Payer: Self-pay | Admitting: Family Medicine

## 2020-07-18 ENCOUNTER — Telehealth (INDEPENDENT_AMBULATORY_CARE_PROVIDER_SITE_OTHER): Payer: BC Managed Care – PPO | Admitting: Family Medicine

## 2020-07-18 VITALS — HR 115 | Temp 99.0°F

## 2020-07-18 DIAGNOSIS — U071 COVID-19: Secondary | ICD-10-CM

## 2020-07-18 MED ORDER — ONDANSETRON HCL 8 MG PO TABS
ORAL_TABLET | ORAL | 2 refills | Status: DC
Start: 2020-07-18 — End: 2021-09-06

## 2020-07-18 NOTE — Progress Notes (Signed)
   Subjective:    Patient ID: Christopher Solis, male    DOB: 1961/02/03, 60 y.o.   MRN: [id number]  HPI Virtual Visit via Telephone Note  I connected with the patient on 07/18/20 at  1:45 PM EST by telephone and verified that I am speaking with the correct person using two identifiers.   I discussed the limitations, risks, security and privacy concerns of performing an evaluation and management service by telephone and the availability of in person appointments. I also discussed with the patient that there may be a patient responsible charge related to this service. The patient expressed understanding and agreed to proceed.  Location patient: home Location provider: work or home office Participants present for the call: patient, provider Patient did not have a visit in the prior 7 days to address this/these issue(s).   History of Present Illness: Here for a Covid-19 infection. For the past several weeks he has felt fatigued and had low grade fevers. He has a dry cough but no chest pain or SOB. His oxygen sat this morning was 98%. No body aches. He has been sneezing frequently. He has nausea but no vomiting. No diarrhea. He tested positive for the Covid virus on 07-06-20.    Observations/Objective: Patient sounds cheerful and well on the phone. I do not appreciate any SOB. Speech and thought processing are grossly intact. Patient reported vitals:  Assessment and Plan: Covid-19 infection. He will rest and drink fluids. Use Zofran as needed for nausea. He is written out of work this week.  Alysia Penna, MD   Follow Up Instructions:     [phone] [phone] 11-20 [phone] 21-30 I did not refer this patient for an OV in the next 24 hours for this/these issue(s).  I discussed the assessment and treatment plan with the patient. The patient was provided an opportunity to ask questions and all were answered. The patient agreed with the plan and demonstrated an understanding of the instructions.    The patient was advised to call back or seek an in-person evaluation if the symptoms worsen or if the condition fails to improve as anticipated.  I provided 10 minutes of non-face-to-face time during this encounter.   Alysia Penna, MD    Review of Systems     Objective:   Physical Exam        Assessment & Plan:

## 2020-07-19 ENCOUNTER — Telehealth: Payer: Self-pay | Admitting: Family Medicine

## 2020-07-19 NOTE — Telephone Encounter (Signed)
[employer]   He called to give [place] the fax number for the patients work note.  FAX #: [phone] ATTN: Corene Cornea

## 2020-07-23 NOTE — Telephone Encounter (Signed)
Letter faxed to [phone] after calling to get a new number.  Faxed confirmed @ 10:45 am. Dm/cma

## 2020-09-28 ENCOUNTER — Other Ambulatory Visit: Payer: Self-pay

## 2020-09-28 ENCOUNTER — Other Ambulatory Visit: Payer: Self-pay | Admitting: Family Medicine

## 2020-09-28 MED ORDER — MONTELUKAST SODIUM 10 MG PO TABS: 1.0000 | ORAL_TABLET | Freq: Every day | ORAL | 1 refills | Status: DC

## 2020-11-30 ENCOUNTER — Other Ambulatory Visit: Payer: Self-pay | Admitting: Family Medicine

## 2021-02-26 ENCOUNTER — Ambulatory Visit (INDEPENDENT_AMBULATORY_CARE_PROVIDER_SITE_OTHER): Payer: Self-pay | Admitting: Family Medicine

## 2021-02-26 ENCOUNTER — Other Ambulatory Visit: Payer: Self-pay

## 2021-02-26 ENCOUNTER — Encounter: Payer: Self-pay | Admitting: Family Medicine

## 2021-02-26 VITALS — BP 132/70 | HR 95 | Temp 98.4°F | Wt 217.5 lb

## 2021-02-26 DIAGNOSIS — M542 Cervicalgia: Secondary | ICD-10-CM

## 2021-02-26 DIAGNOSIS — G8929 Other chronic pain: Secondary | ICD-10-CM

## 2021-02-26 MED ORDER — METHYLPREDNISOLONE ACETATE 40 MG/ML IJ SUSP
40.0000 mg | Freq: Once | INTRAMUSCULAR | Status: AC
  Administered 2021-02-26: 40 mg via INTRAMUSCULAR

## 2021-02-26 MED ORDER — METHOCARBAMOL 750 MG PO TABS
750.0000 mg | ORAL_TABLET | Freq: Four times a day (QID) | ORAL | 2 refills | Status: DC
Start: 2021-02-26 — End: 2022-04-21

## 2021-02-26 MED ORDER — METHYLPREDNISOLONE ACETATE 80 MG/ML IJ SUSP
80.0000 mg | Freq: Once | INTRAMUSCULAR | Status: AC
  Administered 2021-02-26: 80 mg via INTRAMUSCULAR

## 2021-02-26 MED ORDER — METHYLPREDNISOLONE 4 MG PO TBPK: ORAL_TABLET | ORAL | 0 refills | Status: DC
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
## 2021-02-26 NOTE — Addendum Note (Signed)
Addended by: Wyvonne Lenz on: 02/26/2021 04:53 PM   Modules accepted: Orders

## 2021-02-27 ENCOUNTER — Telehealth: Payer: Self-pay | Admitting: Family Medicine

## 2021-02-27 NOTE — Telephone Encounter (Signed)
PT called to advise that he was out of work again for today and needs a note for work that stays he will be out from last Friday till Thursday this week. Please advise when it is available.

## 2021-02-27 NOTE — Telephone Encounter (Signed)
Patient stated that the letter could be faxed to his job.  Letter could be faxed to [phone] when completed. Human resources could be attn.  Please advise.

## 2021-02-27 NOTE — Telephone Encounter (Signed)
Dr Sarajane Jews states that extension for work can be completed for when pt is able to return. Pt states he will return to work tomorrow. Will addend letter & fax to number provided by patient.

## 2021-02-28 NOTE — Telephone Encounter (Signed)
Multiple attempts to fax letter to number requested were unsuccessful. Letter sent to pt via mychart.

## 2021-03-01 NOTE — Telephone Encounter (Signed)
Spoke with pt this afternoon stated that he is not able to get on MyChart to print the letter, state that he will come by the office on Monday to pick up the letter. Will have the letter ready for pickup

## 2021-03-02 ENCOUNTER — Other Ambulatory Visit: Payer: Self-pay | Admitting: Family Medicine

## 2021-03-04 NOTE — Telephone Encounter (Signed)
Spoke with pt aware that the work note was faxed to the fax number provided by pt

## 2021-03-04 NOTE — Telephone Encounter (Signed)
Patient called again with a new fax number for work note to be resent to. Fax number has been given to [employer] on sticky note along with who fax needs to be made out to.    Good callback number is [phone]

## 2021-03-30 ENCOUNTER — Ambulatory Visit
Admission: RE | Admit: 2021-03-30 | Discharge: 2021-03-30 | Disposition: A | Discharge status: Home / Self Care | Payer: BC Managed Care – PPO | Source: Ambulatory Visit | Attending: Family Medicine | Admitting: Family Medicine

## 2021-03-30 ENCOUNTER — Other Ambulatory Visit: Payer: Self-pay

## 2021-03-30 DIAGNOSIS — G8929 Other chronic pain: Secondary | ICD-10-CM

## 2021-03-30 DIAGNOSIS — M542 Cervicalgia: Secondary | ICD-10-CM

## 2021-03-30 DIAGNOSIS — M4802 Spinal stenosis, cervical region: Secondary | ICD-10-CM | POA: Diagnosis not present

## 2021-04-02 ENCOUNTER — Other Ambulatory Visit: Payer: Self-pay | Admitting: Family Medicine

## 2021-04-02 NOTE — Addendum Note (Signed)
Addended by: Alysia Penna A on: 04/02/2021 12:28 PM   Modules accepted: Orders

## 2021-04-12 DIAGNOSIS — M47812 Spondylosis without myelopathy or radiculopathy, cervical region: Secondary | ICD-10-CM | POA: Diagnosis not present

## 2021-04-12 DIAGNOSIS — Z6830 Body mass index (BMI) 30.0-30.9, adult: Secondary | ICD-10-CM | POA: Diagnosis not present

## 2021-04-12 DIAGNOSIS — R030 Elevated blood-pressure reading, without diagnosis of hypertension: Secondary | ICD-10-CM | POA: Diagnosis not present

## 2021-05-02 ENCOUNTER — Other Ambulatory Visit: Payer: Self-pay | Admitting: Family Medicine

## 2021-05-16 ENCOUNTER — Telehealth (INDEPENDENT_AMBULATORY_CARE_PROVIDER_SITE_OTHER): Payer: BC Managed Care – PPO | Admitting: Family Medicine

## 2021-05-16 ENCOUNTER — Telehealth: Payer: Self-pay | Admitting: Family Medicine

## 2021-05-16 ENCOUNTER — Encounter: Payer: Self-pay | Admitting: Family Medicine

## 2021-05-16 VITALS — Temp 99.2°F

## 2021-05-16 DIAGNOSIS — R6889 Other general symptoms and signs: Secondary | ICD-10-CM | POA: Diagnosis not present

## 2021-05-16 NOTE — Patient Instructions (Addendum)
   ---------------------------------------------------------------------------------------------------------------------------      WORK SLIP:  Patient Dat Christopher Solis,  02/15/1961, was seen for a medical visit today, 05/16/21 . Please excuse from work for a COVID/flu like illness. If Covid19 testing is positive advise 10 days minimum from the onset of symptoms (05/11/21) PLUS 1 day of no fever and improved symptoms. Will defer to employer for a sooner return to work if patient has 2 negative covid tests 24-48 hours apart and is feeling better, or if symptoms have resolved, it is greater than 5 days since the positive test and the patient can wear a high-quality, tight fitting mask such as N95 or KN95 at all times for an additional 5 days.  Sincerely: E-signature: Dr. Colin Benton, DO [place] Ph: [phone]   ------------------------------------------------------------------------------------------------------------------------------    HOME CARE TIPS:  -can use tylenol or aleve if needed for fevers, aches and pains per instructions  -can use nasal saline a few times per day if you have nasal congestion  -stay hydrated, drink plenty of fluids and eat small healthy meals   -can take 1000 IU (31mcg) Vit D3 and 100-500 mg of Vit C daily per instructions  -follow up with your doctor in 2-3 days unless improving and feeling better  -stay home while sick, except to seek medical care. If you have COVID19, you will likely be contagious for 7-10 days. Flu or Influenza is likely contagious for about 7 days. Other respiratory viral infections remain contagious for 5-10+ days depending on the virus and many other factors. Wear a good mask that fits snugly (such as N95 or KN95) if around others to reduce the risk of transmission.  It was nice to meet you today, and I really hope you are feeling better soon. I help [facility] out with telemedicine visits on Tuesdays and  Thursdays and am happy to help if you need a follow up virtual visit on those days. Otherwise, if you have any concerns or questions following this visit please schedule a follow up visit with your Primary Care doctor or seek care at a local urgent care clinic to avoid delays in care.    Seek in person care or schedule a follow up video visit promptly if your symptoms worsen, new concerns arise or you are not improving with treatment. Call 911 and/or seek emergency care if your symptoms are severe or life threatening.

## 2021-05-16 NOTE — Progress Notes (Signed)
Virtual Visit via Video Note  I connected with Christopher Solis  on 05/16/21 at  1:00 PM EST by a video enabled telemedicine application and verified that I am speaking with the correct person using two identifiers.  Location patient: home, [place] Location provider:work or home office Persons participating in the virtual visit: patient, provider  I discussed the limitations of evaluation and management by telemedicine and the availability of in person appointments. The patient expressed understanding and agreed to proceed.   HPI:  Acute telemedicine visit for Cough and congestion: -Onset: about 6-7 days -Symptoms include: sore throat, nasal congestion, cough, body aches and fevers initially, poor appetite -improving with cough much better, no temperature over 100 -home covid test is negative -wondering if can go back to work; his mom died of covid and he wants to avoid getting anyone sick -Denies: CP, SOB,  -Pertinent past medical history: see below -Pertinent medication allergies:  Allergies  Allergen Reactions   Morphine Nausea And Vomiting   Amoxicillin     nausea   Propoxyphene N-Acetaminophen     Causes migraine, Darvocet N  -COVID-19 vaccine status:  Immunization History  Administered Date(s) Administered   Influenza Split 03/18/2013   Influenza,inj,Quad PF,6+ Mos 06/19/2014, 05/20/2016   Influenza-Unspecified 03/19/2015, 03/09/2018   PFIZER(Purple Top)SARS-COV-2 Vaccination 10/14/2019, 11/14/2019, 06/05/2020, 10/23/2020   Tdap 10/15/2012    ROS: See pertinent positives and negatives per HPI.  Past Medical History:  Diagnosis Date   Allergy    Colon polyps    adenomatous   Depression    Low testosterone    Osteoarthritis    Rectal mass    tubulovillous adenoma, s/p surgical resection    Past Surgical History:  Procedure Laterality Date   COLON SURGERY  07/20/2013   removal of tubulovillous polyp in rectosigmoid colon at [facility]    COLONOSCOPY  06/14/2015   per  Dr. Havery Moros, adenomatous polyps, repeat in 3 years    ELBOW SURGERY Right    reconstructive / right elbow   MASS EXCISION Right 05/11/2018   Procedure: EXCISION RIGHT  BUTTOCK   MASS;  Surgeon: Erroll Luna, MD;  Location: [facility];  Service: General;  Laterality: Right;   TONSILLECTOMY     VASECTOMY     WISDOM TOOTH EXTRACTION       Current Outpatient Medications:    Cyanocobalamin (VITAMIN B-12) 1000 MCG SUBL, PLACE 1 TABLET UNDER THE TONGUE ONCE DAILY, Disp: 30 tablet, Rfl: 11   ibuprofen (ADVIL,MOTRIN) 800 MG tablet, Take 1 tablet (800 mg total) by mouth every 8 (eight) hours as needed., Disp: 30 tablet, Rfl: 0   loratadine (CLARITIN) 10 MG tablet, Take 20 mg by mouth daily., Disp: , Rfl:    methocarbamol (ROBAXIN-750) 750 MG tablet, Take 1 tablet (750 mg total) by mouth 4 (four) times daily., Disp: 120 tablet, Rfl: 2   montelukast (SINGULAIR) 10 MG tablet, TAKE 1 TABLET BY MOUTH AT BEDTIME, Disp: 30 tablet, Rfl: 2   ondansetron (ZOFRAN) 8 MG tablet, TAKE ONE TABLET BY MOUTH EVERY 6 HOURS AS NEEDED FOR NAUSEA AND FOR VOMITING, Disp: 30 tablet, Rfl: 2   sildenafil (VIAGRA) 100 MG tablet, TAKE 1 TABLET BY MOUTH ONCE DAILY AS NEEDED FOR ERECTILE DYSFUNCTION, Disp: 10 tablet, Rfl: 11   zolpidem (AMBIEN) 10 MG tablet, TAKE 1 TABLET BY MOUTH AT BEDTIME AS NEEDED FOR SLEEP, Disp: 30 tablet, Rfl: 5  EXAM:  VITALS per patient if applicable:  GENERAL: alert, oriented, appears well and in no acute  distress  HEENT: atraumatic, conjunttiva clear, no obvious abnormalities on inspection of external nose and ears  NECK: normal movements of the head and neck  LUNGS: on inspection no signs of respiratory distress, breathing rate appears normal, no obvious gross SOB, gasping or wheezing  CV: no obvious cyanosis  MS: moves all visible extremities without noticeable abnormality  PSYCH/NEURO: pleasant and cooperative, no obvious depression or anxiety, speech and thought  processing grossly intact  ASSESSMENT AND PLAN:  Discussed the following assessment and plan:  Flu-like symptoms  -we discussed possible serious and likely etiologies, options for evaluation and workup, limitations of telemedicine visit vs in person visit, treatment, treatment risks and precautions. Pt is agreeable to treatment via telemedicine at this moment. Likely had flu, covid or other viral resp illness. Glad he is doing better. Discussed average contagious period for each given his concerns.  Work/School slipped offered: provided in patient instructions Advised to seek prompt in person care if worsening, new symptoms arise, or if is not improving with treatment. Discussed options for inperson care if PCP office not available. Did let this patient know that I only do telemedicine on Tuesdays and Thursdays for [place]. Advised to schedule follow up visit with PCP or [facility] if any further questions or concerns to avoid delays in care.   I discussed the assessment and treatment plan with the patient. The patient was provided an opportunity to ask questions and all were answered. The patient agreed with the plan and demonstrated an understanding of the instructions.     Lucretia Kern, DO

## 2021-05-16 NOTE — Telephone Encounter (Signed)
Patient calling in with respiratory symptoms: Shortness of breath, chest pain, palpitations or other red words send to Triage  Does the patient have a fever over 100, cough, congestion, sore throat, runny nose, lost of taste/smell within the last 5 days (please list symptoms that patient has)? Bodyaches, temp 99.2 ,dry throat x 5 days tested for Covid in the last 5 days? No   If yes, was it positive []  OR negative [] ? If positive in the last 5 days, please schedule virtual visit now. If negative, schedule for an in person OV with the next available provider if PCP has no openings. Please also let patient know they will be tested again (follow the script below)  "you will have to arrive 40mins prior to your appt time to be Covid tested. Please park in back of office at the cone & call [phone] to let the staff know you have arrived. A staff member will meet you at your car to do a rapid covid test. Once the test has resulted you will be notified by phone of your results to determine if appt will remain an in person visit or be converted to a virtual/phone visit. If you arrive less than 48mins before your appt time, your visit will be automatically converted to virtual & any recommended testing will happen AFTER the visit."  Pt has virtual with dr Maudie Mercury 05-16-2021 at 1 pm  [place]  If no availability for virtual visit in office,  please schedule another [place] office  If no availability at another [facility] office, please instruct patient that they can schedule an evisit or virtual visit through their mychart account. Visits up to 8pm  patients can be seen in office 5 days after positive COVID test

## 2021-08-15 ENCOUNTER — Other Ambulatory Visit: Payer: Self-pay | Admitting: Family Medicine

## 2021-08-26 ENCOUNTER — Ambulatory Visit (INDEPENDENT_AMBULATORY_CARE_PROVIDER_SITE_OTHER): Payer: BC Managed Care – PPO | Admitting: Family Medicine

## 2021-08-26 ENCOUNTER — Encounter: Payer: Self-pay | Admitting: Family Medicine

## 2021-08-26 VITALS — BP 160/80 | HR 84 | Temp 98.0°F | Ht 69.5 in | Wt 206.0 lb

## 2021-08-26 DIAGNOSIS — N2889 Other specified disorders of kidney and ureter: Secondary | ICD-10-CM | POA: Diagnosis not present

## 2021-08-26 DIAGNOSIS — R110 Nausea: Secondary | ICD-10-CM | POA: Diagnosis not present

## 2021-08-26 MED ORDER — PROMETHAZINE HCL 25 MG PO TABS: 25.0000 mg | ORAL_TABLET | ORAL | 1 refills | Status: DC | PRN

## 2021-08-26 MED ORDER — ESOMEPRAZOLE MAGNESIUM 40 MG PO CPDR: 40.0000 mg | DELAYED_RELEASE_CAPSULE | Freq: Every day | ORAL | 2 refills | Status: DC

## 2021-08-26 NOTE — Progress Notes (Signed)
? ?  Subjective:  ? ? Patient ID: Christopher Solis, male    DOB: 04-Mar-1961, 61 y.o.   MRN: [id number] ? ?HPI ?Here for 3 weeks of intermittent nausea without vomiting. He has waves of nausea that last about 20-30 minutes and then they ease up. No abdominal pain. His appetite is good. No fever. His BMs are regular. He had used Omeprazole for GERD up until a year ago when he stopped it. He has tried Zofran for the nausea with no relief.  ? ? ?Review of Systems  ?Constitutional: Negative.   ?Respiratory: Negative.    ?Cardiovascular: Negative.   ?Gastrointestinal:  Positive for nausea. Negative for abdominal distention, abdominal pain, blood in stool, constipation, diarrhea and vomiting.  ? ?   ?Objective:  ? Physical Exam ?Constitutional:   ?   Appearance: Normal appearance. He is not ill-appearing.  ?Cardiovascular:  ?   Rate and Rhythm: Normal rate and regular rhythm.  ?   Pulses: Normal pulses.  ?   Heart sounds: Normal heart sounds.  ?Pulmonary:  ?   Effort: Pulmonary effort is normal.  ?   Breath sounds: Normal breath sounds.  ?Abdominal:  ?   General: Abdomen is flat. Bowel sounds are normal. There is no distension.  ?   Palpations: Abdomen is soft. There is no mass.  ?   Tenderness: There is no abdominal tenderness. There is no guarding or rebound.  ?   Hernia: No hernia is present.  ?Neurological:  ?   Mental Status: He is alert.  ? ? ? ? ? ?   ?Assessment & Plan:  ?Nausea, possibly related to gastritis. He will start on Nexium 40 mg daily. Try Phenergan for the nausea. We will get labs today and set up an abdominal US sometime soon.  ?Alysia Penna, MD ? ? ?

## 2021-08-27 LAB — CBC WITH DIFFERENTIAL/PLATELET
Basophils Absolute: 0.1 10*3/uL (ref 0.0–0.1)
Basophils Relative: 1.1 % (ref 0.0–3.0)
Eosinophils Absolute: 0.2 10*3/uL (ref 0.0–0.7)
Eosinophils Relative: 1.7 % (ref 0.0–5.0)
HCT: 45.9 % (ref 39.0–52.0)
Hemoglobin: 15.9 g/dL (ref 13.0–17.0)
Lymphocytes Relative: 28.8 % (ref 12.0–46.0)
Lymphs Abs: 2.7 10*3/uL (ref 0.7–4.0)
MCHC: 34.6 g/dL (ref 30.0–36.0)
MCV: 95.5 fl (ref 78.0–100.0)
Monocytes Absolute: 1.1 10*3/uL — ABNORMAL HIGH (ref 0.1–1.0)
Monocytes Relative: 12.2 % — ABNORMAL HIGH (ref 3.0–12.0)
Neutro Abs: 5.2 10*3/uL (ref 1.4–7.7)
Neutrophils Relative %: 56.2 % (ref 43.0–77.0)
Platelets: 338 10*3/uL (ref 150.0–400.0)
RBC: 4.81 Mil/uL (ref 4.22–5.81)
RDW: 12.8 % (ref 11.5–15.5)
WBC: 9.3 10*3/uL (ref 4.0–10.5)

## 2021-08-27 LAB — HEPATIC FUNCTION PANEL
ALT: 28 U/L (ref 0–53)
AST: 24 U/L (ref 0–37)
Albumin: 4.9 g/dL (ref 3.5–5.2)
Alkaline Phosphatase: 74 U/L (ref 39–117)
Bilirubin, Direct: 0.1 mg/dL (ref 0.0–0.3)
Total Bilirubin: 0.3 mg/dL (ref 0.2–1.2)
Total Protein: 7.3 g/dL (ref 6.0–8.3)

## 2021-08-27 LAB — BASIC METABOLIC PANEL
BUN: 12 mg/dL (ref 6–23)
CO2: 28 mEq/L (ref 19–32)
Calcium: 9.6 mg/dL (ref 8.4–10.5)
Chloride: 102 mEq/L (ref 96–112)
Creatinine, Ser: 0.92 mg/dL (ref 0.40–1.50)
GFR: 89.61 mL/min (ref >60.00)
Glucose, Bld: 80 mg/dL (ref 70–99)
Potassium: 3.8 mEq/L (ref 3.5–5.1)
Sodium: 138 mEq/L (ref 135–145)

## 2021-08-27 LAB — AMYLASE: Amylase: 30 U/L (ref 27–131)

## 2021-09-06 ENCOUNTER — Telehealth: Payer: Self-pay | Admitting: Family Medicine

## 2021-09-06 ENCOUNTER — Other Ambulatory Visit: Payer: Self-pay

## 2021-09-06 ENCOUNTER — Ambulatory Visit
Admission: RE | Admit: 2021-09-06 | Discharge: 2021-09-06 | Disposition: A | Discharge status: Home / Self Care | Payer: BC Managed Care – PPO | Source: Ambulatory Visit | Attending: Family Medicine | Admitting: Family Medicine

## 2021-09-06 DIAGNOSIS — K7689 Other specified diseases of liver: Secondary | ICD-10-CM | POA: Diagnosis not present

## 2021-09-06 DIAGNOSIS — N281 Cyst of kidney, acquired: Secondary | ICD-10-CM | POA: Diagnosis not present

## 2021-09-06 DIAGNOSIS — R110 Nausea: Secondary | ICD-10-CM | POA: Diagnosis not present

## 2021-09-06 MED ORDER — ONDANSETRON HCL 8 MG PO TABS
ORAL_TABLET | ORAL | 2 refills | Status: DC
Start: 2021-09-06 — End: 2022-04-21

## 2021-09-06 NOTE — Telephone Encounter (Signed)
Spoke with pt state that he will come by the office to pick up work note, pt also requested for a different nausea medication, Zofran sent to pt pharmacy ?

## 2021-09-06 NOTE — Telephone Encounter (Signed)
Patient states he needs to pick up the note he requested before the end of the day. ?

## 2021-09-06 NOTE — Telephone Encounter (Signed)
Ok for work note? 

## 2021-09-06 NOTE — Telephone Encounter (Signed)
Pt seen dr fry on 08-26-2021 and pt is calling to report the nausea medication is not working and he was out of work from 3-27-3-31-2023 and would like work note. Pt has abd ultrasound today.  Pt would like an appt to see dr fry today  ?

## 2021-09-06 NOTE — Telephone Encounter (Signed)
Please get him a work note. We will call him when the [place] results are back  ?

## 2021-09-06 NOTE — Telephone Encounter (Signed)
Encounter already resolved ?

## 2021-09-09 ENCOUNTER — Ambulatory Visit: Payer: BC Managed Care – PPO | Admitting: Family Medicine

## 2021-09-09 NOTE — Addendum Note (Signed)
Addended by: Alysia Penna A on: 09/09/2021 07:55 AM ? ? Modules accepted: Orders ? ?

## 2021-09-13 ENCOUNTER — Other Ambulatory Visit: Payer: Self-pay | Admitting: Family Medicine

## 2021-10-03 ENCOUNTER — Ambulatory Visit
Admission: RE | Admit: 2021-10-03 | Discharge: 2021-10-03 | Disposition: A | Discharge status: Home / Self Care | Payer: BC Managed Care – PPO | Source: Ambulatory Visit | Attending: Family Medicine | Admitting: Family Medicine

## 2021-10-03 DIAGNOSIS — K760 Fatty (change of) liver, not elsewhere classified: Secondary | ICD-10-CM | POA: Diagnosis not present

## 2021-10-03 DIAGNOSIS — M5136 Other intervertebral disc degeneration, lumbar region: Secondary | ICD-10-CM | POA: Diagnosis not present

## 2021-10-03 DIAGNOSIS — N2889 Other specified disorders of kidney and ureter: Secondary | ICD-10-CM

## 2021-10-03 DIAGNOSIS — I700 Atherosclerosis of aorta: Secondary | ICD-10-CM | POA: Diagnosis not present

## 2021-10-03 DIAGNOSIS — N281 Cyst of kidney, acquired: Secondary | ICD-10-CM | POA: Diagnosis not present

## 2021-10-03 MED ORDER — GADOBENATE DIMEGLUMINE 529 MG/ML IV SOLN
18.0000 mL | Freq: Once | INTRAVENOUS | Status: AC | PRN
  Administered 2021-10-03: 18 mL via INTRAVENOUS

## 2022-01-22 ENCOUNTER — Ambulatory Visit (INDEPENDENT_AMBULATORY_CARE_PROVIDER_SITE_OTHER): Payer: BC Managed Care – PPO | Admitting: Podiatry

## 2022-01-22 DIAGNOSIS — M7751 Other enthesopathy of right foot: Secondary | ICD-10-CM | POA: Diagnosis not present

## 2022-01-22 DIAGNOSIS — M7752 Other enthesopathy of left foot: Secondary | ICD-10-CM

## 2022-01-22 DIAGNOSIS — M19079 Primary osteoarthritis, unspecified ankle and foot: Secondary | ICD-10-CM

## 2022-01-22 NOTE — Progress Notes (Signed)
Subjective:  Patient ID: Christopher Solis, male    DOB: 04-01-1961,  MRN: [id number]  Chief Complaint  Patient presents with   Foot Pain    bil foot injections/right foot Great toes    61 y.o. male presents with the above complaint.  Patient presents with bilateral first metatarsophalangeal joint arthritis with underlying capsulitis.  Patient is painful to touch the right one is greater than left side.  He states the injection is gone in the past has helped considerably.  He would like to get another injection to both of the joints.  He states he gave him 2 years of relief.  He denies any other acute complaints she has not seen anyone else prior to seeing me.  Hurts with ambulation.   Review of Systems: Negative except as noted in the HPI. Denies N/V/F/Ch.  Past Medical History:  Diagnosis Date   Allergy    Colon polyps    adenomatous   Depression    Low testosterone    Osteoarthritis    Rectal mass    tubulovillous adenoma, s/p surgical resection    Current Outpatient Medications:    Cyanocobalamin (VITAMIN B-12) 1000 MCG SUBL, PLACE 1 TABLET UNDER THE TONGUE ONCE DAILY, Disp: 30 tablet, Rfl: 11   esomeprazole (NEXIUM) 40 MG capsule, Take 1 capsule (40 mg total) by mouth daily at 12 noon., Disp: 30 capsule, Rfl: 2   ibuprofen (ADVIL,MOTRIN) 800 MG tablet, Take 1 tablet (800 mg total) by mouth every 8 (eight) hours as needed., Disp: 30 tablet, Rfl: 0   loratadine (CLARITIN) 10 MG tablet, Take 20 mg by mouth daily., Disp: , Rfl:    methocarbamol (ROBAXIN-750) 750 MG tablet, Take 1 tablet (750 mg total) by mouth 4 (four) times daily., Disp: 120 tablet, Rfl: 2   montelukast (SINGULAIR) 10 MG tablet, TAKE 1 TABLET BY MOUTH AT BEDTIME, Disp: 30 tablet, Rfl: 0   ondansetron (ZOFRAN) 8 MG tablet, TAKE ONE TABLET BY MOUTH EVERY 6 HOURS AS NEEDED FOR NAUSEA AND FOR VOMITING, Disp: 30 tablet, Rfl: 2   promethazine (PHENERGAN) 25 MG tablet, Take 1 tablet (25 mg total) by mouth every 4 (four)  hours as needed for nausea or vomiting., Disp: 60 tablet, Rfl: 1   sildenafil (VIAGRA) 100 MG tablet, TAKE 1 TABLET BY MOUTH ONCE DAILY AS NEEDED FOR ERECTILE DYSFUNCTION, Disp: 10 tablet, Rfl: 11   zolpidem (AMBIEN) 10 MG tablet, TAKE 1 TABLET BY MOUTH AT BEDTIME AS NEEDED FOR SLEEP, Disp: 30 tablet, Rfl: 5  Social History   Tobacco Use  Smoking Status Every Day   Packs/day: 1.50   Years: 37.00   Total pack years: 55.50   Types: Cigarettes  Smokeless Tobacco Former   Quit date: 06/22/1980    Allergies  Allergen Reactions   Morphine Nausea And Vomiting   Amoxicillin     nausea   Propoxyphene N-Acetaminophen     Causes migraine, Darvocet N   Objective:  There were no vitals filed for this visit. There is no height or weight on file to calculate BMI. Constitutional Well developed. Well nourished.  Vascular Dorsalis pedis pulses palpable bilaterally. Posterior tibial pulses palpable bilaterally. Capillary refill normal to all digits.  No cyanosis or clubbing noted. Pedal hair growth normal.  Neurologic Normal speech. Oriented to person, place, and time. Epicritic sensation to light touch grossly present bilaterally.  Dermatologic Nails well groomed and normal in appearance. No open wounds. No skin lesions.  Orthopedic: Pain on palpation to bilateral  first metatarsophalangeal joint limited range of motion noted arthritis pain on palpation.  Crepitus noted.  Limited range of motion.   Radiographs: None Assessment:   1. Arthritis of first MTP joint   2. Capsulitis of metatarsophalangeal (MTP) joint of right foot   3. Capsulitis of metatarsophalangeal (MTP) joint of left foot    Plan:  Patient was evaluated and treated and all questions answered.  Bilateral first metatarsophalangeal joint capsulitis with underlying osteoarthritis -All questions and concerns were discussed with the patient in extensive detail. -Given the amount of pain that he is having I believe benefit  from steroid injection at decreasing inflammatory component associate with pain.  Patient agrees with plan like to proceed with steroid injection -A steroid injection was performed at bilateral first metatarsophalangeal joint using 1% plain Lidocaine and 10 mg of Kenalog. This was well tolerated. -Also discussed shoe gear modification as well as importance of orthotics.  He states understanding   No follow-ups on file.  Bilateral first MPJ capsulitis with underlying arthritis.  Injections given x2

## 2022-04-21 ENCOUNTER — Ambulatory Visit (INDEPENDENT_AMBULATORY_CARE_PROVIDER_SITE_OTHER): Payer: BC Managed Care – PPO | Admitting: Family Medicine

## 2022-04-21 ENCOUNTER — Encounter: Payer: Self-pay | Admitting: Family Medicine

## 2022-04-21 VITALS — BP 124/80 | HR 79 | Temp 98.3°F | Wt 191.0 lb

## 2022-04-21 DIAGNOSIS — M79644 Pain in right finger(s): Secondary | ICD-10-CM

## 2022-04-21 DIAGNOSIS — G8929 Other chronic pain: Secondary | ICD-10-CM

## 2022-04-21 DIAGNOSIS — L858 Other specified epidermal thickening: Secondary | ICD-10-CM

## 2022-04-21 NOTE — Progress Notes (Signed)
   Subjective:    Patient ID: Christopher Solis, male    DOB: 06/08/1961, 61 y.o.   MRN: [id number]  HPI Here for pain at the base of the right thumb that started 9 months ago. No hx of trauma. No warmth or redness or swelling. He has been wearing a brace at work. Also he wants [place] to check a lesion on the right index finger that first came up about 8 years ago. It has slowly grown larger over time. It is sometimes painful. He saw the [facility] about 5 years ago and they tried to treat this with cryotherapy, but this did not work. Tim and I both have decided it should be removed surgically.    Review of Systems  Constitutional: Negative.   Respiratory: Negative.    Cardiovascular: Negative.   Musculoskeletal:  Positive for arthralgias.       Objective:   Physical Exam Constitutional:      Appearance: Normal appearance.  Cardiovascular:     Rate and Rhythm: Normal rate and regular rhythm.     Pulses: Normal pulses.     Heart sounds: Normal heart sounds.  Pulmonary:     Effort: Pulmonary effort is normal.     Breath sounds: Normal breath sounds.  Musculoskeletal:     Comments: He is quite tender in the [facility] joint of the right thumb. ROM is full. There is also a hard tender round lesion on the right medial index finger.   Neurological:     Mental Status: He is alert.           Assessment & Plan:  He has thumb pain likely due to osteoarthritis. He would benefit from a steroid injection. 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 We will refer him to Hand Surgery who could treat both of these problems.  Alysia Penna, MD

## 2022-06-12 DIAGNOSIS — M79644 Pain in right finger(s): Secondary | ICD-10-CM | POA: Diagnosis not present

## 2022-06-12 DIAGNOSIS — M180 Bilateral primary osteoarthritis of first carpometacarpal joints: Secondary | ICD-10-CM | POA: Diagnosis not present

## 2022-06-12 DIAGNOSIS — R2231 Localized swelling, mass and lump, right upper limb: Secondary | ICD-10-CM | POA: Diagnosis not present

## 2022-09-04 ENCOUNTER — Ambulatory Visit: Payer: BC Managed Care – PPO | Admitting: Family Medicine

## 2022-09-04 ENCOUNTER — Encounter: Payer: Self-pay | Admitting: Family Medicine

## 2022-09-04 VITALS — BP 132/78 | HR 100 | Temp 97.9°F | Wt 193.0 lb

## 2022-09-04 DIAGNOSIS — M9901 Segmental and somatic dysfunction of cervical region: Secondary | ICD-10-CM | POA: Diagnosis not present

## 2022-09-04 DIAGNOSIS — M9902 Segmental and somatic dysfunction of thoracic region: Secondary | ICD-10-CM | POA: Diagnosis not present

## 2022-09-04 DIAGNOSIS — M542 Cervicalgia: Secondary | ICD-10-CM | POA: Diagnosis not present

## 2022-09-04 DIAGNOSIS — M5384 Other specified dorsopathies, thoracic region: Secondary | ICD-10-CM | POA: Diagnosis not present

## 2022-09-04 DIAGNOSIS — M50322 Other cervical disc degeneration at C5-C6 level: Secondary | ICD-10-CM | POA: Diagnosis not present

## 2022-09-04 MED ORDER — METHYLPREDNISOLONE ACETATE 40 MG/ML IJ SUSP
40.0000 mg | Freq: Once | INTRAMUSCULAR | Status: AC
  Administered 2022-09-04: 40 mg via INTRAMUSCULAR

## 2022-09-04 MED ORDER — METHYLPREDNISOLONE ACETATE 80 MG/ML IJ SUSP
80.0000 mg | Freq: Once | INTRAMUSCULAR | Status: AC
  Administered 2022-09-04: 80 mg via INTRAMUSCULAR

## 2022-09-04 NOTE — Addendum Note (Signed)
Addended by: Wyvonne Lenz on: 09/04/2022 11:58 AM   Modules accepted: Orders

## 2022-09-04 NOTE — Progress Notes (Signed)
   Subjective:    Patient ID: Christopher Solis, male    DOB: [birth year], 62 y.o.   MRN: [id number]  HPI Here for 5 days of left sided sharp neck pain. No radiation of pain to the arms. He woke up with this pain one morning. He has chronic neck pain that flares up occasionally. A cervical spine MRI in 2022 showed moderate to severe spondylosis throughout. He has been using heat, ice, Tylenol, and BioFreeze.    Review of Systems  Constitutional: Negative.   Respiratory: Negative.    Cardiovascular: Negative.   Musculoskeletal:  Positive for neck pain.       Objective:   Physical Exam Constitutional:      Appearance: Normal appearance.  Cardiovascular:     Rate and Rhythm: Normal rate and regular rhythm.     Pulses: Normal pulses.     Heart sounds: Normal heart sounds.  Pulmonary:     Effort: Pulmonary effort is normal.     Breath sounds: Normal breath sounds.  Musculoskeletal:     Comments: He is tender on the left side of the middle cervical spine. ROM is limited by spasm.   Neurological:     Mental Status: He is alert.           Assessment & Plan:  Acute exacerbation of chronic neck pain. He is given a shot of DepoMedrol. We will refer him to [facility] for chiropractic treatment.  Alysia Penna, MD

## 2022-12-02 ENCOUNTER — Emergency Department (HOSPITAL_BASED_OUTPATIENT_CLINIC_OR_DEPARTMENT_OTHER)
Admission: EM | Admit: 2022-12-02 | Discharge: 2022-12-02 | Disposition: A | Discharge status: Home / Self Care | Payer: BC Managed Care – PPO | Attending: Emergency Medicine | Admitting: Emergency Medicine

## 2022-12-02 ENCOUNTER — Other Ambulatory Visit: Payer: Self-pay

## 2022-12-02 ENCOUNTER — Encounter (HOSPITAL_BASED_OUTPATIENT_CLINIC_OR_DEPARTMENT_OTHER): Payer: Self-pay

## 2022-12-02 DIAGNOSIS — H00012 Hordeolum externum right lower eyelid: Secondary | ICD-10-CM | POA: Insufficient documentation

## 2022-12-02 DIAGNOSIS — H9201 Otalgia, right ear: Secondary | ICD-10-CM | POA: Diagnosis not present

## 2022-12-02 DIAGNOSIS — H00011 Hordeolum externum right upper eyelid: Secondary | ICD-10-CM | POA: Diagnosis not present

## 2022-12-02 MED ORDER — FLUORESCEIN SODIUM 1 MG OP STRP
1.0000 | ORAL_STRIP | Freq: Once | OPHTHALMIC | Status: DC
  Filled 2022-12-02: qty 1

## 2022-12-02 MED ORDER — ERYTHROMYCIN 5 MG/GM OP OINT
TOPICAL_OINTMENT | Freq: Four times a day (QID) | OPHTHALMIC | Status: DC
  Filled 2022-12-02: qty 3.5

## 2022-12-02 MED ORDER — TETRACAINE HCL 0.5 % OP SOLN
1.0000 [drp] | Freq: Once | OPHTHALMIC | Status: DC
  Filled 2022-12-02: qty 4

## 2022-12-02 NOTE — ED Triage Notes (Signed)
Redness/swelling under right eye since Sunday. C/o pain, burning, itching and drainage. Denies vision changes.

## 2022-12-02 NOTE — ED Provider Notes (Signed)
EMERGENCY DEPARTMENT AT [facility] Provider Note   CSN: [id number] Arrival date & time: 12/02/22  0818     History  Chief Complaint  Patient presents with   Eye Pain    Christopher Solis is a 62 y.o. male.  Patient here with bump on his right lower eyelid.  Denies any eye pain or vision loss.  Started the last day or 2.  Denies any headache, fever, chills.  No significant medical history.  The history is provided by the patient.       Home Medications Prior to Admission medications   Medication Sig Start Date End Date Taking? Authorizing Provider  Acetaminophen (TYLENOL 8 HOUR PO) Take by mouth.    [provider]  loratadine (CLARITIN) 10 MG tablet Take 20 mg by mouth daily.    [provider]      Allergies    Morphine, Amoxicillin, and Propoxyphene n-acetaminophen    Review of Systems   Review of Systems  Physical Exam Updated Vital Signs There were no vitals taken for this visit. Physical Exam Vitals and nursing note reviewed.  Constitutional:      General: He is not in acute distress.    Appearance: He is well-developed.  HENT:     Head: Normocephalic and atraumatic.  Eyes:     Extraocular Movements: Extraocular movements intact.     Conjunctiva/sclera: Conjunctivae normal.     Pupils: Pupils are equal, round, and reactive to light.     Comments: He has a stye to the right lower eyelid, normal extraocular movements, normal visual fields, no pain with eye movement, no signs of redness or swelling otherwise  Cardiovascular:     Rate and Rhythm: Normal rate and regular rhythm.     Heart sounds: No murmur heard. Pulmonary:     Effort: Pulmonary effort is normal. No respiratory distress.     Breath sounds: Normal breath sounds.  Abdominal:     Palpations: Abdomen is soft.     Tenderness: There is no abdominal tenderness.  Musculoskeletal:        General: No swelling.     Cervical back: Neck supple.  Skin:     General: Skin is warm and dry.     Capillary Refill: Capillary refill takes less than 2 seconds.  Neurological:     Mental Status: He is alert.  Psychiatric:        Mood and Affect: Mood normal.     ED Results / Procedures / Treatments   Labs (all labs ordered are listed, but only abnormal results are displayed) Labs Reviewed - No data to display  EKG None  Radiology No results found.  Procedures Procedures    Medications Ordered in ED Medications  tetracaine (PONTOCAINE) 0.5 % ophthalmic solution 1 drop (has no administration in time range)  fluorescein ophthalmic strip 1 strip (has no administration in time range)    ED Course/ Medical Decision Making/ A&P                             Medical Decision Making Risk Prescription drug management.   Christopher Solis is here with stye to the right lower eyelid.  I have no concern for other acute process.  Will place on antibiotics with erythromycin eye ointment.  Recommend warm compresses.  Will refer to ophthalmology if needed.  Discharged in good condition.  This chart was dictated using voice recognition software.  Despite best efforts to proofread,  errors can occur which can change the documentation meaning.         Final Clinical Impression(s) / ED Diagnoses Final diagnoses:  None    Rx / DC Orders ED Discharge Orders     None         Virgina Norfolk, DO 12/02/22 [phone]

## 2022-12-02 NOTE — Discharge Instructions (Signed)
Recommend a warm compress/hand towel several times a day.  You have inflamed hair follicle that should improve.  Antibiotic ointment you can place every 6 hours for the next 3 to 5 days.  Follow-up with ophthalmologist if needed with Dr. Dione Booze

## 2023-01-01 DIAGNOSIS — M180 Bilateral primary osteoarthritis of first carpometacarpal joints: Secondary | ICD-10-CM | POA: Diagnosis not present

## 2023-02-05 DIAGNOSIS — M180 Bilateral primary osteoarthritis of first carpometacarpal joints: Secondary | ICD-10-CM | POA: Diagnosis not present

## 2023-05-12 ENCOUNTER — Ambulatory Visit: Payer: BC Managed Care – PPO | Admitting: Family Medicine

## 2023-05-12 ENCOUNTER — Encounter: Payer: Self-pay | Admitting: Family Medicine

## 2023-05-12 VITALS — BP 126/74 | HR 81 | Temp 98.0°F | Ht 69.5 in | Wt 186.0 lb

## 2023-05-12 DIAGNOSIS — G8929 Other chronic pain: Secondary | ICD-10-CM | POA: Diagnosis not present

## 2023-05-12 DIAGNOSIS — Z125 Encounter for screening for malignant neoplasm of prostate: Secondary | ICD-10-CM | POA: Diagnosis not present

## 2023-05-12 DIAGNOSIS — M79645 Pain in left finger(s): Secondary | ICD-10-CM | POA: Diagnosis not present

## 2023-05-12 DIAGNOSIS — Z1211 Encounter for screening for malignant neoplasm of colon: Secondary | ICD-10-CM

## 2023-05-12 DIAGNOSIS — Z0000 Encounter for general adult medical examination without abnormal findings: Secondary | ICD-10-CM | POA: Diagnosis not present

## 2023-05-12 DIAGNOSIS — M79644 Pain in right finger(s): Secondary | ICD-10-CM | POA: Diagnosis not present

## 2023-05-12 NOTE — Progress Notes (Signed)
Subjective:    Patient ID: Christopher Solis, male    DOB: 1960/11/17, 62 y.o.   MRN: [id number]  HPI Here for a well exam. 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 He has seen Dr. Dairl Ponder for this, and has had cortisone injections into the MCP joints in both hands. These were not successful however, so surgery was recommended. Tim says he wants to wait until he retires to have this done (about 18 months from now).    Review of Systems  Constitutional: Negative.   HENT: Negative.    Eyes: Negative.   Respiratory: Negative.    Cardiovascular: Negative.   Gastrointestinal: Negative.   Genitourinary: Negative.   Musculoskeletal:  Positive for arthralgias.  Skin: Negative.   Neurological: Negative.   Psychiatric/Behavioral: Negative.         Objective:   Physical Exam Constitutional:      General: He is not in acute distress.    Appearance: Normal appearance. He is well-developed. He is not diaphoretic.  HENT:     Head: Normocephalic and atraumatic.     Right Ear: External ear normal.     Left Ear: External ear normal.     Nose: Nose normal.     Mouth/Throat:     Pharynx: No oropharyngeal exudate.  Eyes:     General: No scleral icterus.       Right eye: No discharge.        Left eye: No discharge.     Conjunctiva/sclera: Conjunctivae normal.     Pupils: Pupils are equal, round, and reactive to light.  Neck:     Thyroid: No thyromegaly.     Vascular: No JVD.     Trachea: No tracheal deviation.  Cardiovascular:     Rate and Rhythm: Normal rate and regular rhythm.     Pulses: Normal pulses.     Heart sounds: Normal heart sounds. No murmur heard.    No friction rub. No gallop.  Pulmonary:     Effort: Pulmonary effort is normal. No respiratory distress.     Breath sounds: Normal breath sounds. No wheezing or rales.  Chest:     Chest wall: No tenderness.  Abdominal:     General: Bowel sounds are normal. There is no distension.      Palpations: Abdomen is soft. There is no mass.     Tenderness: There is no abdominal tenderness. There is no guarding or rebound.  Genitourinary:    Penis: Normal. No tenderness.      Testes: Normal.     Prostate: Normal.     Rectum: Normal. Guaiac result negative.  Musculoskeletal:        General: No tenderness. Normal range of motion.     Cervical back: Neck supple.  Lymphadenopathy:     Cervical: No cervical adenopathy.  Skin:    General: Skin is warm and dry.     Coloration: Skin is not pale.     Findings: No erythema or rash.  Neurological:     General: No focal deficit present.     Mental Status: He is alert and oriented to person, place, and time.     Cranial Nerves: No cranial nerve deficit.     Motor: No abnormal muscle tone.     Coordination: Coordination normal.     Deep Tendon Reflexes: Reflexes are normal and symmetric. Reflexes normal.  Psychiatric:        Mood and Affect: Mood normal.  Behavior: Behavior normal.        Thought Content: Thought content normal.        Judgment: Judgment normal.           Assessment & Plan:  Well exam. We discussed diet and exercise. Get fasting labs. He is past due for another colonoscopy, but Tim declines to get one. Instead we will order a Cologuard test.  Gershon Crane, MD

## 2023-05-13 LAB — BASIC METABOLIC PANEL
BUN: 13 mg/dL (ref 6–23)
CO2: 30 meq/L (ref 19–32)
Calcium: 10.2 mg/dL (ref 8.4–10.5)
Chloride: 101 meq/L (ref 96–112)
Creatinine, Ser: 0.89 mg/dL (ref 0.40–1.50)
GFR: 91.21 mL/min (ref >60.00)
Glucose, Bld: 91 mg/dL (ref 70–99)
Potassium: 4 meq/L (ref 3.5–5.1)
Sodium: 141 meq/L (ref 135–145)

## 2023-05-13 LAB — HEPATIC FUNCTION PANEL
ALT: 12 U/L (ref 0–53)
AST: 15 U/L (ref 0–37)
Albumin: 4.9 g/dL (ref 3.5–5.2)
Alkaline Phosphatase: 74 U/L (ref 39–117)
Bilirubin, Direct: 0.1 mg/dL (ref 0.0–0.3)
Total Bilirubin: 0.4 mg/dL (ref 0.2–1.2)
Total Protein: 7.2 g/dL (ref 6.0–8.3)

## 2023-05-13 LAB — CBC WITH DIFFERENTIAL/PLATELET
Basophils Absolute: 0.1 10*3/uL (ref 0.0–0.1)
Basophils Relative: 0.9 % (ref 0.0–3.0)
Eosinophils Absolute: 0.1 10*3/uL (ref 0.0–0.7)
Eosinophils Relative: 0.9 % (ref 0.0–5.0)
HCT: 47.6 % (ref 39.0–52.0)
Hemoglobin: 16.3 g/dL (ref 13.0–17.0)
Lymphocytes Relative: 21 % (ref 12.0–46.0)
Lymphs Abs: 2.2 10*3/uL (ref 0.7–4.0)
MCHC: 34.2 g/dL (ref 30.0–36.0)
MCV: 97.8 fL (ref 78.0–100.0)
Monocytes Absolute: 1.1 10*3/uL — ABNORMAL HIGH (ref 0.1–1.0)
Monocytes Relative: 10.9 % (ref 3.0–12.0)
Neutro Abs: 7 10*3/uL (ref 1.4–7.7)
Neutrophils Relative %: 66.3 % (ref 43.0–77.0)
Platelets: 366 10*3/uL (ref 150.0–400.0)
RBC: 4.87 Mil/uL (ref 4.22–5.81)
RDW: 12.5 % (ref 11.5–15.5)
WBC: 10.5 10*3/uL (ref 4.0–10.5)

## 2023-05-13 LAB — LDL CHOLESTEROL, DIRECT: Direct LDL: 110 mg/dL

## 2023-05-13 LAB — HEMOGLOBIN A1C: Hgb A1c MFr Bld: 5.9 % (ref 4.6–6.5)

## 2023-05-13 LAB — LIPID PANEL
Cholesterol: 211 mg/dL — ABNORMAL HIGH (ref 0–200)
HDL: 27.7 mg/dL — ABNORMAL LOW (ref >39.00)
Total CHOL/HDL Ratio: 8
Triglycerides: 482 mg/dL — ABNORMAL HIGH (ref 0.0–149.0)

## 2023-05-13 LAB — PSA: PSA: 3.61 ng/mL (ref 0.10–4.00)

## 2023-05-13 LAB — TSH: TSH: 1.8 u[IU]/mL (ref 0.35–5.50)

## 2023-05-18 DIAGNOSIS — Z1211 Encounter for screening for malignant neoplasm of colon: Secondary | ICD-10-CM | POA: Diagnosis not present

## 2023-05-24 LAB — COLOGUARD: COLOGUARD: NEGATIVE

## 2024-03-02 ENCOUNTER — Ambulatory Visit: Payer: Self-pay

## 2024-03-02 NOTE — Telephone Encounter (Signed)
 FYI Only or Action Required?: FYI only for provider.  Patient was last seen in primary care on 05/12/2023 by Johnny Garnette LABOR, MD.  Called Nurse Triage reporting Cough.  Symptoms began a week ago.  Interventions attempted: OTC medications: Mucinex and cold meds.  Symptoms are: gradually worsening.  Triage Disposition: See PCP When Office is Open (Within 3 Days)  Patient/caregiver understands and will follow disposition?: Yes     Copied from CRM #[id number]. Topic: Clinical - Red Word Triage >> Mar 02, 2024  2:14 PM Anairis L wrote: [facility] that prompted transfer to Nurse Triage: Chest pain/Believes its pneumonia. Reason for Disposition  [1] Nasal discharge AND [2] present > 10 days    Patient states he had sinus drainage at the beginning of symptoms.  Answer Assessment - Initial Assessment Questions Patient states symptoms started with a sinus infection with drainage in his throat. He also has noticed some mild soreness in shoulder blades between chest area. Currently taking cold meds and Mucinex for symptoms. Patient denies wheezing or difficulty breathing.     1. ONSET: When did the cough begin?     02/19/2024 2. SEVERITY: How bad is the cough today?      Moderate  3. SPUTUM: Describe the color of your sputum (e.g., none, dry cough; clear, white, yellow, green)     White/ greenish color  4. HEMOPTYSIS: Are you coughing up any blood? If Yes, ask: How much? (e.g., flecks, streaks, tablespoons, etc.)     Denies  5. DIFFICULTY BREATHING: Are you having difficulty breathing? If Yes, ask: How bad is it? (e.g., mild, moderate, severe)      Denies  6. FEVER: Do you have a fever? If Yes, ask: What is your temperature, how was it measured, and when did it start?     Denies  7. CARDIAC HISTORY: Do you have any history of heart disease? (e.g., heart attack, congestive heart failure)      Denies  8. LUNG HISTORY: Do you have any history of lung disease?  (e.g.,  pulmonary embolus, asthma, emphysema)     Denies  9. PE RISK FACTORS: Do you have a history of blood clots? (or: recent major surgery, recent prolonged travel, bedridden)     Denies  10. OTHER SYMPTOMS: Do you have any other symptoms? (e.g., runny nose, wheezing, chest pain)       Chest pressure from coughing  Protocols used: Cough - Acute Productive-A-AH

## 2024-03-04 ENCOUNTER — Ambulatory Visit: Admitting: Family Medicine

## 2024-03-28 IMAGING — US US ABDOMEN COMPLETE
1 series · 13 of 25 positions shown · non-contrast
Comparison: CT abdomen and pelvis 11/30/2012

CLINICAL DATA: Intermittent nausea.

EXAM:
ABDOMEN ULTRASOUND COMPLETE

[Series 1: us abdomen complete · 0.19mm/px · 13 of 110 slices shown]
[im 1/110]
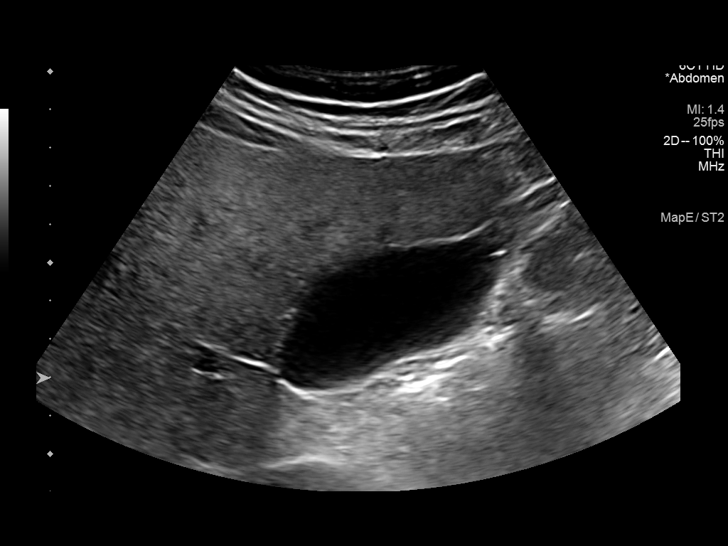
[im 10/110]
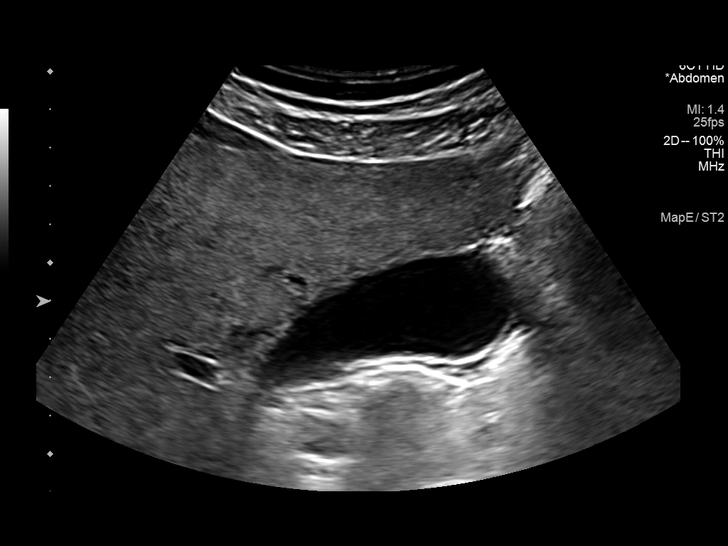
[im 19/110]
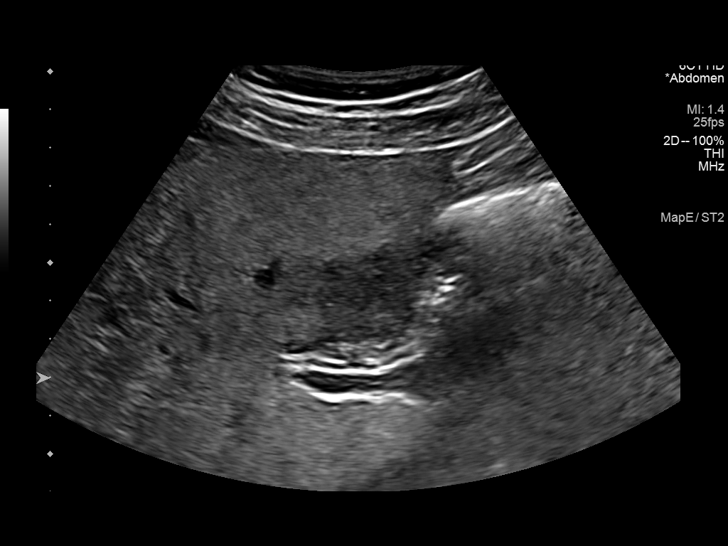
[im 28/110]
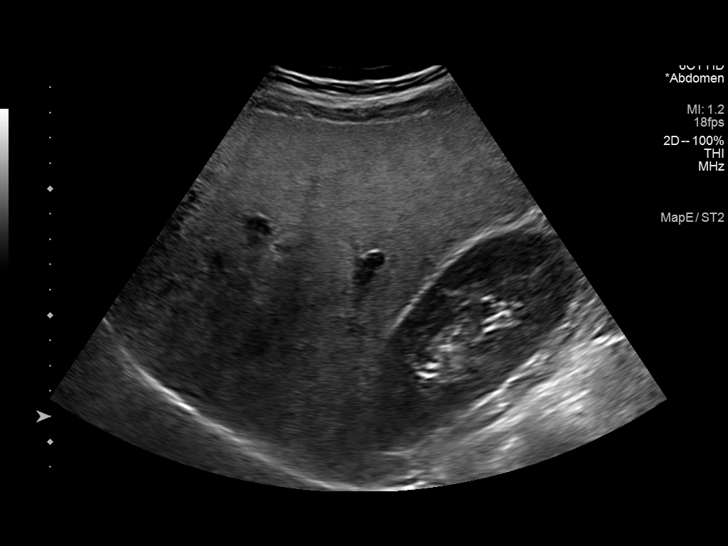
[im 37/110]
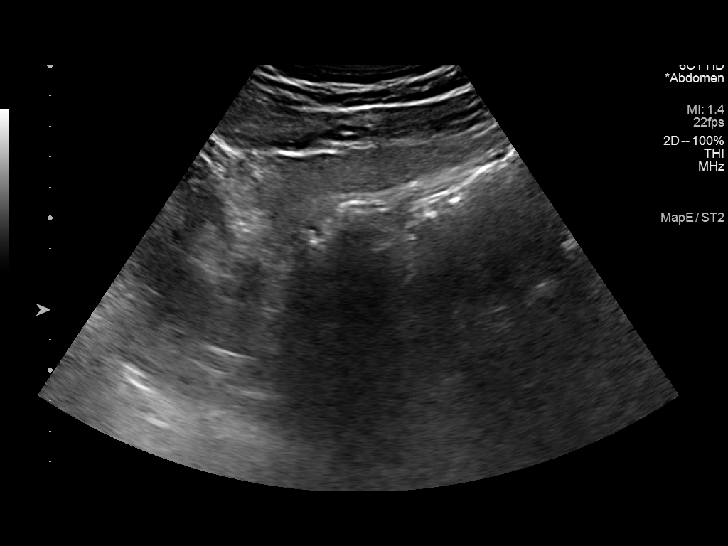
[im 46/110]
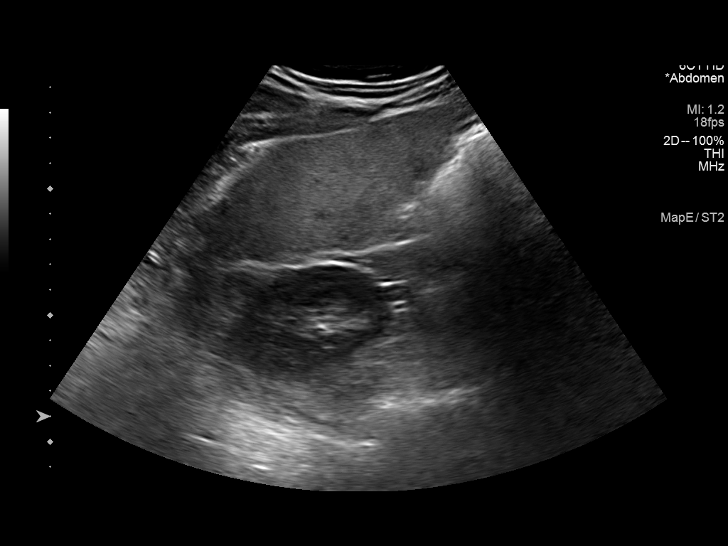
[im 55/110]
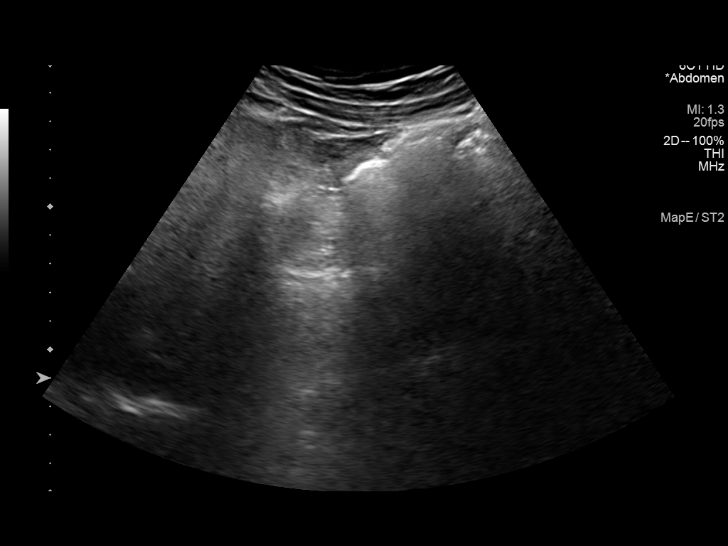
[im 64/110]
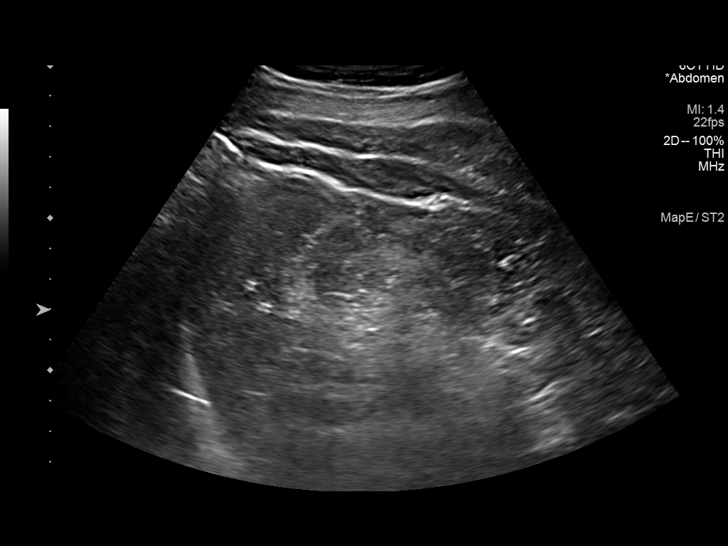
[im 73/110]
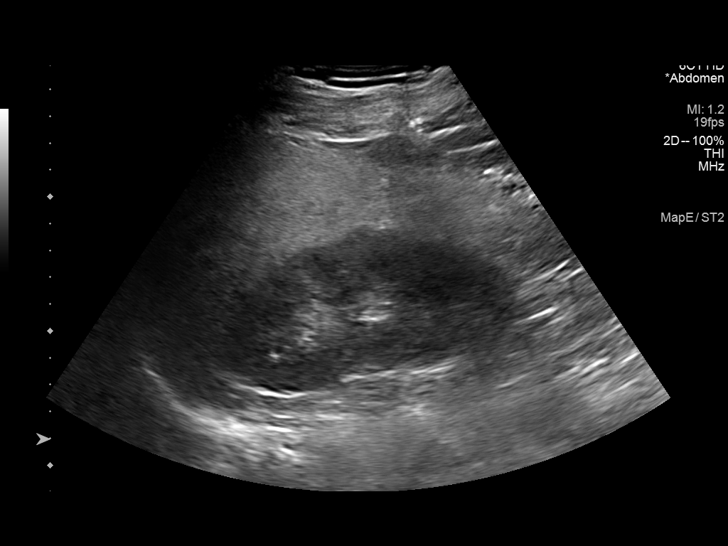
[im 82/110]
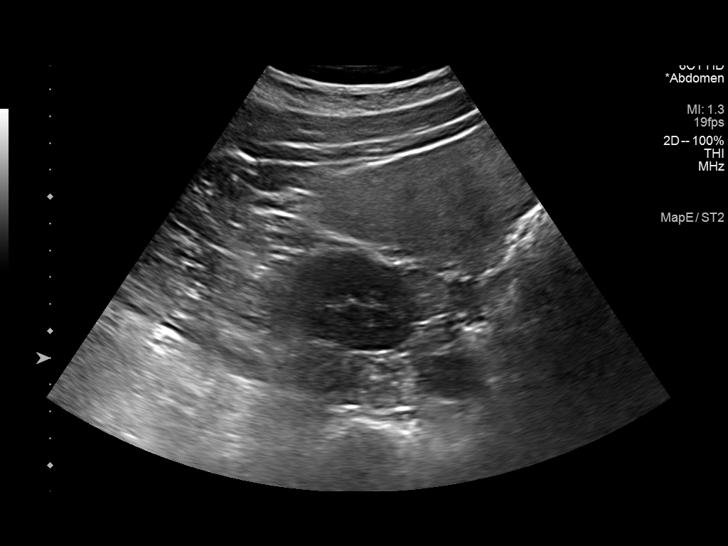
[im 91/110]
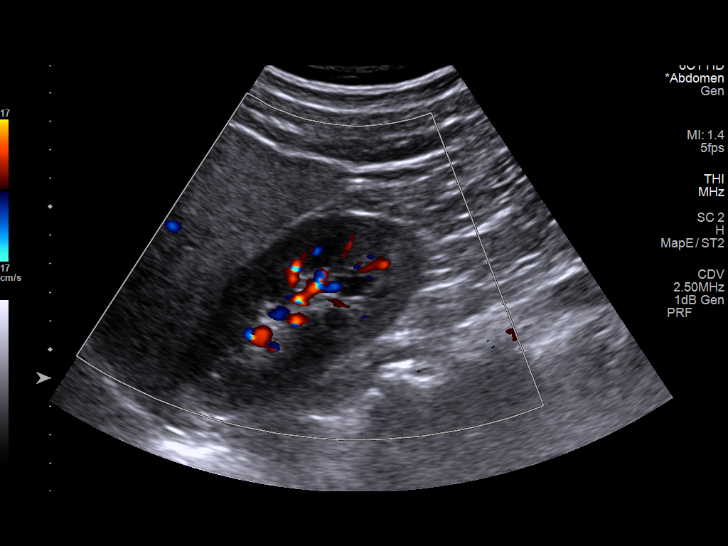
[im 100/110]
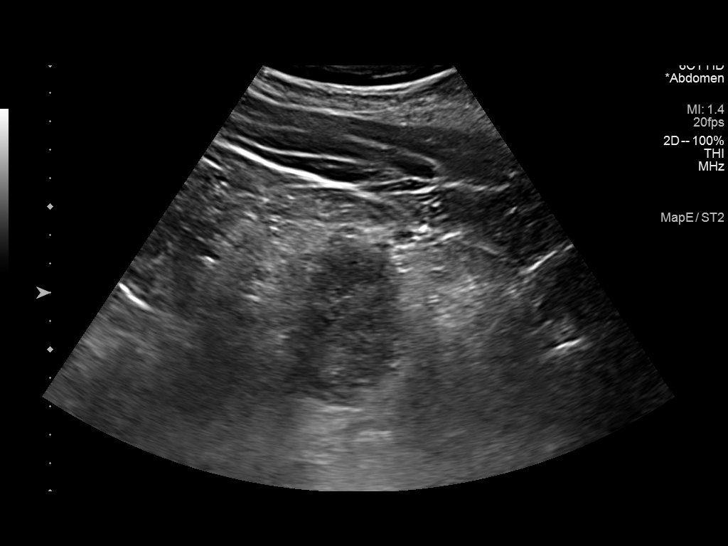
[im 110/110]
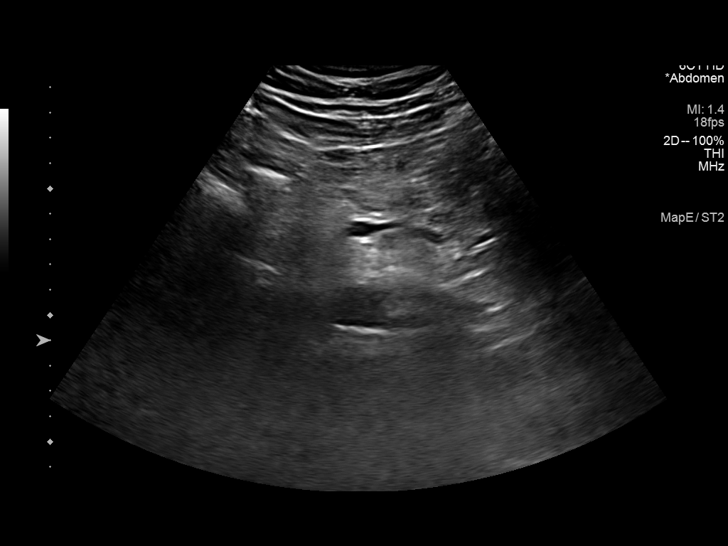

[13 of 25 positions shown; findings below may reference images not displayed]

FINDINGS: Gallbladder: No gallstones or wall thickening visualized. No
sonographic Murphy sign noted by sonographer.

Common bile duct: Diameter: 2.2 mm

Liver: No focal lesion identified. Heterogeneous increased
parenchymal echogenicity. Portal vein is patent on color Doppler
imaging with normal direction of blood flow towards the liver.

IVC: No abnormality visualized.

Pancreas: Visualized portion unremarkable.

Spleen: Normal in size. Echogenic foci scattered throughout the
spleen may represent calcified granulomas.

Right Kidney: Length: 11.1 cm. Echogenicity within normal limits. No
hydronephrosis. There is a 1.1 x 1.0 x 1.2 cm cystic area with
internal echoes in the mid right kidney. This was not seen on prior
CT.

Left Kidney: Length: 10.0 cm. Echogenicity within normal limits. No
mass or hydronephrosis visualized.

Abdominal aorta: No aneurysm visualized.

Other findings: None.
IMPRESSION: 1. Heterogeneous echogenic liver likely related to diffuse
hepatocellular disease such as fatty infiltration. Please correlate
clinically.
2. 1.2 cm complex cystic lesion in the right kidney, indeterminate.
Recommend further evaluation with MRI.
